# Patient Record
Sex: Male | Born: 1965 | Race: White | Hispanic: No | Smoking: Former smoker
Health system: Southern US, Community
[De-identification: ages and names within clinical notes are randomized; demographics above are authoritative.]

## PROBLEM LIST (undated history)

## (undated) DIAGNOSIS — Z87442 Personal history of urinary calculi: Secondary | ICD-10-CM

## (undated) DIAGNOSIS — R319 Hematuria, unspecified: Secondary | ICD-10-CM

## (undated) DIAGNOSIS — N201 Calculus of ureter: Secondary | ICD-10-CM

## (undated) DIAGNOSIS — N200 Calculus of kidney: Secondary | ICD-10-CM

## (undated) HISTORY — PX: OTHER SURGICAL HISTORY: SHX169

---

## 2004-08-02 ENCOUNTER — Emergency Department: Payer: Self-pay | Admitting: Emergency Medicine

## 2013-09-16 ENCOUNTER — Emergency Department: Payer: Self-pay | Admitting: Emergency Medicine

## 2013-10-23 ENCOUNTER — Encounter: Payer: Self-pay | Admitting: General Surgery

## 2013-10-23 ENCOUNTER — Ambulatory Visit (INDEPENDENT_AMBULATORY_CARE_PROVIDER_SITE_OTHER): Payer: BC Managed Care – PPO | Admitting: General Surgery

## 2013-10-23 VITALS — BP 140/86 | HR 90 | Temp 97.9°F | Resp 16 | Ht 69.0 in | Wt 259.0 lb

## 2013-10-23 DIAGNOSIS — T63391A Toxic effect of venom of other spider, accidental (unintentional), initial encounter: Secondary | ICD-10-CM

## 2013-10-23 DIAGNOSIS — T63301A Toxic effect of unspecified spider venom, accidental (unintentional), initial encounter: Secondary | ICD-10-CM

## 2013-10-23 DIAGNOSIS — T6391XA Toxic effect of contact with unspecified venomous animal, accidental (unintentional), initial encounter: Secondary | ICD-10-CM

## 2013-10-23 NOTE — Progress Notes (Signed)
Patient ID: Kerry MachoRoger D Dalziel, male   DOB: [birth year], 48 y.o.   MRN: [id number]  Chief Complaint  Patient presents with  . Other    spider bit     HPI [name] is a 48 y.o. male here today for an brown reclus bit on his right leg. He states it is swelling, red and painful.  He noticed this on 10/19/13.   HPI  History reviewed. No pertinent past medical history.  History reviewed. No pertinent past surgical history.  History reviewed. No pertinent family history.  Social History History  Substance Use Topics  . Smoking status: Never Smoker   . Smokeless tobacco: Never Used  . Alcohol Use: No    No Known Allergies  Current Outpatient Prescriptions  Medication Sig Dispense Refill  . cephALEXin (KEFLEX) 500 MG capsule Take 500 mg by mouth 4 (four) times daily.      . minocycline (DYNACIN) 100 MG tablet Take 100 mg by mouth 2 (two) times daily.       No current facility-administered medications for this visit.    Review of Systems Review of Systems  Constitutional: Negative.   Respiratory: Negative.   Cardiovascular: Negative.     Blood pressure 140/86, pulse 90, temperature 97.9 F (36.6 C), resp. rate 16, height 5\' 9"  (1.753 m), weight 259 lb (117.482 kg).  Physical Exam Physical Exam  Constitutional: He is oriented to person, place, and time. He appears well-developed and well-nourished.  Pulmonary/Chest: Effort normal and breath sounds normal.  Neurological: He is alert and oriented to person, place, and time.  Skin: Skin is warm and dry.  Right leg 5 mm skin opening alone the shin with surrounding induration and mild redness. No apparent skin necrosis    Data Reviewed    Assessment    Small wound right leg, possible insect bite but no esign of necrosis. Pt started keflex 2 days ago.     Plan    Continue with Keflex. Recheck in 2 week        SANKAR,SEEPLAPUTHUR G 10/24/2013, 5:58 AM

## 2013-10-23 NOTE — Patient Instructions (Addendum)
Patient to return in two weeks. Keep area cleansed and covered with a dressing. Call for any worsening or new problems in the area

## 2013-10-24 ENCOUNTER — Encounter: Payer: Self-pay | Admitting: General Surgery

## 2013-11-03 ENCOUNTER — Ambulatory Visit: Payer: BC Managed Care – PPO

## 2013-11-04 ENCOUNTER — Telehealth: Payer: Self-pay | Admitting: *Deleted

## 2013-11-04 NOTE — Telephone Encounter (Signed)
I talked with the patient and he forgot his appointment. He states the area is healed. The antibiotics cleared it up and it looks good. Follow up as needed, pt agrees.

## 2013-12-08 ENCOUNTER — Encounter (HOSPITAL_COMMUNITY): Payer: BC Managed Care – PPO | Admitting: Anesthesiology

## 2013-12-08 ENCOUNTER — Encounter (HOSPITAL_COMMUNITY)
Admission: EM | Disposition: A | Discharge status: Home / Self Care | Payer: Self-pay | Source: Home / Self Care | Attending: Emergency Medicine

## 2013-12-08 ENCOUNTER — Emergency Department (HOSPITAL_COMMUNITY)
Admission: EM | Admit: 2013-12-08 | Discharge: 2013-12-09 | Disposition: A | Discharge status: Home / Self Care | Payer: BC Managed Care – PPO | Attending: Emergency Medicine | Admitting: Emergency Medicine

## 2013-12-08 ENCOUNTER — Emergency Department (HOSPITAL_COMMUNITY): Payer: BC Managed Care – PPO | Admitting: Anesthesiology

## 2013-12-08 ENCOUNTER — Encounter (HOSPITAL_COMMUNITY): Payer: Self-pay | Admitting: Emergency Medicine

## 2013-12-08 ENCOUNTER — Emergency Department: Payer: Self-pay | Admitting: Emergency Medicine

## 2013-12-08 DIAGNOSIS — N200 Calculus of kidney: Secondary | ICD-10-CM

## 2013-12-08 DIAGNOSIS — N201 Calculus of ureter: Secondary | ICD-10-CM | POA: Insufficient documentation

## 2013-12-08 HISTORY — PX: CYSTOSCOPY/RETROGRADE/URETEROSCOPY: SHX5316

## 2013-12-08 LAB — URINALYSIS, COMPLETE
BILIRUBIN, UR: NEGATIVE
Glucose,UR: NEGATIVE mg/dL (ref 0–75)
Ketone: NEGATIVE
Nitrite: NEGATIVE
Ph: 5 (ref 4.5–8.0)
Protein: NEGATIVE
RBC,UR: 14 /HPF (ref 0–5)
Specific Gravity: 1.03 (ref 1.003–1.030)
Squamous Epithelial: 4

## 2013-12-08 LAB — CBC WITH DIFFERENTIAL/PLATELET
BASOS ABS: 0.1 10*3/uL (ref 0.0–0.1)
Basophil %: 0.5 %
Eosinophil #: 0.2 10*3/uL (ref 0.0–0.7)
Eosinophil %: 1.6 %
HCT: 50.8 % (ref 40.0–52.0)
HGB: 16.6 g/dL (ref 13.0–18.0)
Lymphocyte #: 2.4 10*3/uL (ref 1.0–3.6)
Lymphocyte %: 17.6 %
MCH: 30.6 pg (ref 26.0–34.0)
MCHC: 32.8 g/dL (ref 32.0–36.0)
MCV: 94 fL (ref 80–100)
MONO ABS: 1.2 x10 3/mm — AB (ref 0.2–1.0)
Monocyte %: 8.9 %
NEUTROS ABS: 9.6 10*3/uL — AB (ref 1.4–6.5)
Neutrophil %: 71.4 %
Platelet: 230 10*3/uL (ref 150–440)
RBC: 5.43 10*6/uL (ref 4.40–5.90)
RDW: 13.4 % (ref 11.5–14.5)
WBC: 13.4 10*3/uL — AB (ref 3.8–10.6)

## 2013-12-08 LAB — COMPREHENSIVE METABOLIC PANEL
Albumin: 3.5 g/dL (ref 3.4–5.0)
Alkaline Phosphatase: 70 U/L
Anion Gap: 11 (ref 7–16)
BUN: 19 mg/dL — AB (ref 7–18)
Bilirubin,Total: 0.8 mg/dL (ref 0.2–1.0)
CO2: 22 mmol/L (ref 21–32)
CREATININE: 1.7 mg/dL — AB (ref 0.60–1.30)
Calcium, Total: 8.5 mg/dL (ref 8.5–10.1)
Chloride: 106 mmol/L (ref 98–107)
EGFR (African American): 53 — ABNORMAL LOW
GFR CALC NON AF AMER: 46 — AB
GLUCOSE: 156 mg/dL — AB (ref 65–99)
Osmolality: 283 (ref 275–301)
Potassium: 3.5 mmol/L (ref 3.5–5.1)
SGOT(AST): 33 U/L (ref 15–37)
SGPT (ALT): 32 U/L
SODIUM: 139 mmol/L (ref 136–145)
TOTAL PROTEIN: 7.7 g/dL (ref 6.4–8.2)

## 2013-12-08 LAB — LIPASE, BLOOD: LIPASE: 163 U/L (ref 73–393)

## 2013-12-08 IMAGING — CT CT STONE STUDY
2 of 4 series · 16 of 46 positions shown, 18 images · non-contrast
Comparison: None.

CLINICAL DATA: Flank pain with nausea and microscopic hematuria

EXAM:
CT ABDOMEN AND PELVIS WITHOUT CONTRAST
TECHNIQUE: Multidetector CT imaging of the abdomen and pelvis was performed
following the standard protocol without oral or intravenous contrast
material administration.

[Series 2: stone standard full · axial · 0.95mm/px · z∈[-800,-335]mm · 13 of 103 slices shown, 15 images]
[im 5/103  soft-tissue]
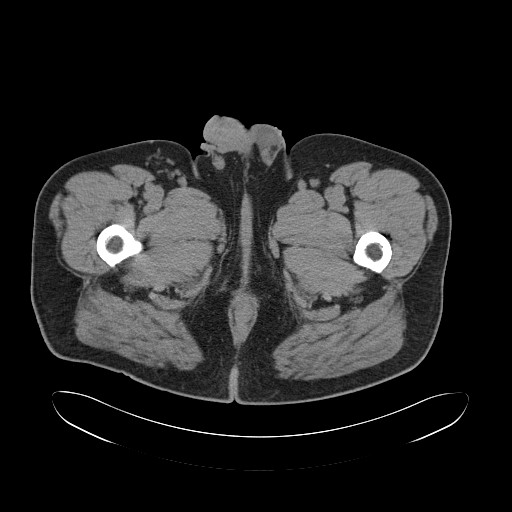
[im 5/103  bone]
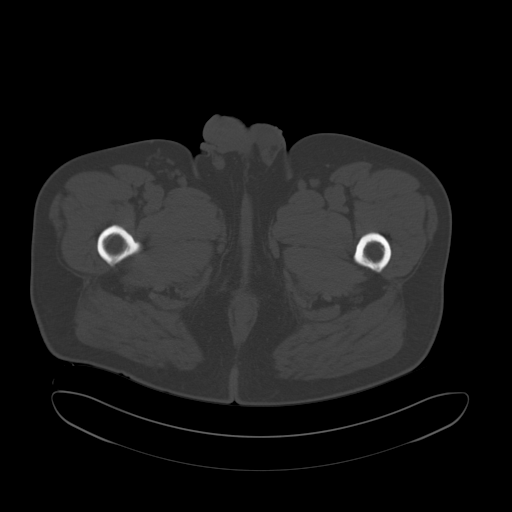
[im 13/103  soft-tissue]
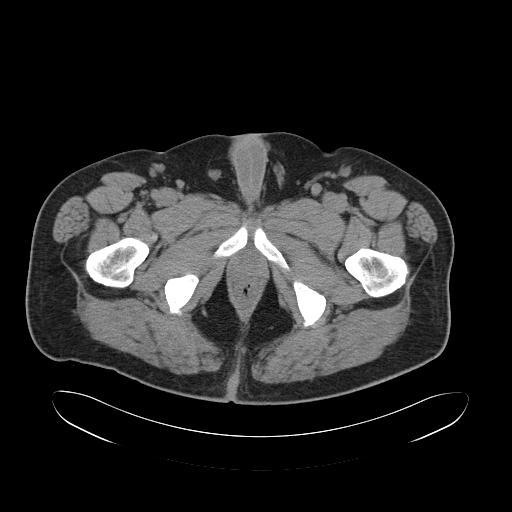
[im 21/103  soft-tissue]
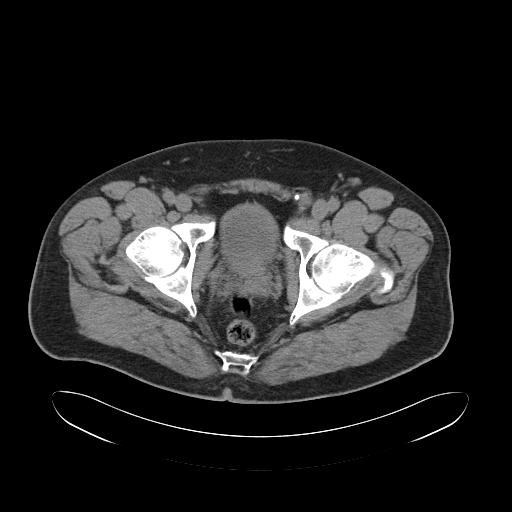
[im 29/103  soft-tissue]
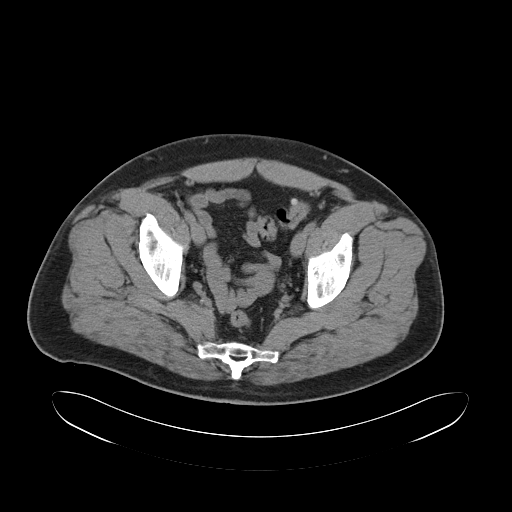
[im 37/103  soft-tissue]
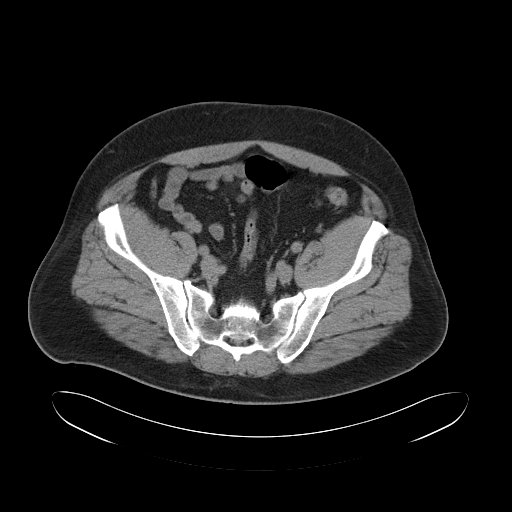
[im 45/103  soft-tissue]
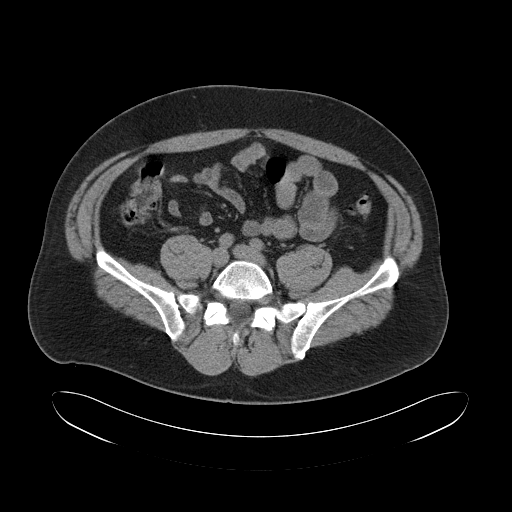
[im 54/103  soft-tissue]
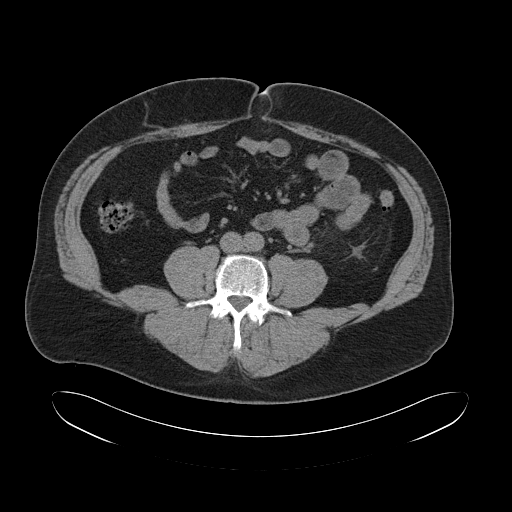
[im 58/103  soft-tissue]
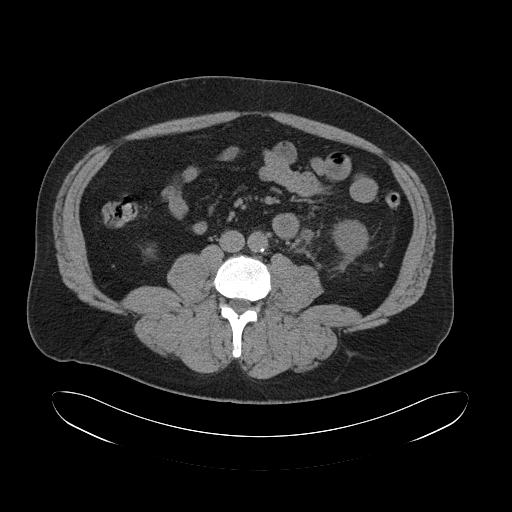
[im 66/103  soft-tissue]
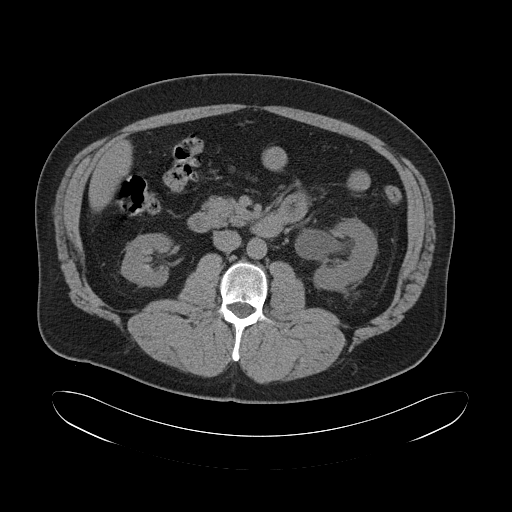
[im 66/103  bone]
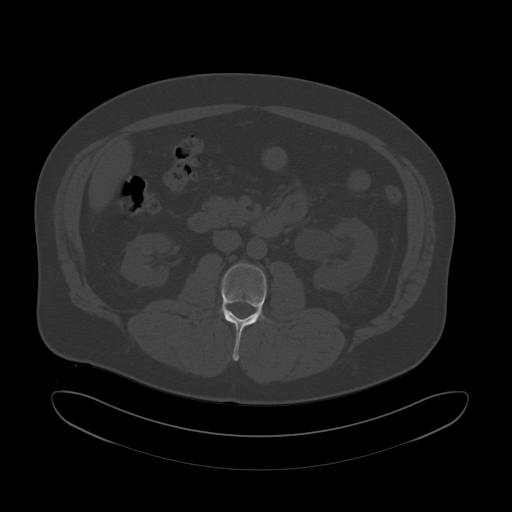
[im 74/103  soft-tissue]
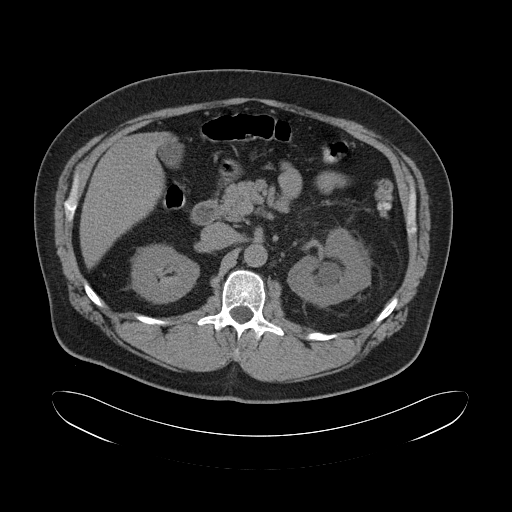
[im 82/103  soft-tissue]
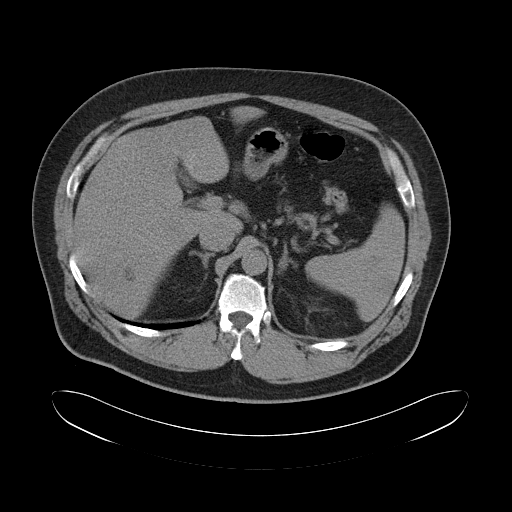
[im 90/103  soft-tissue]
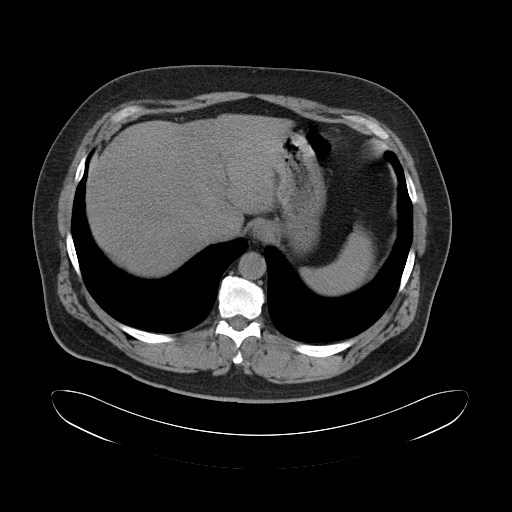
[im 98/103  soft-tissue]
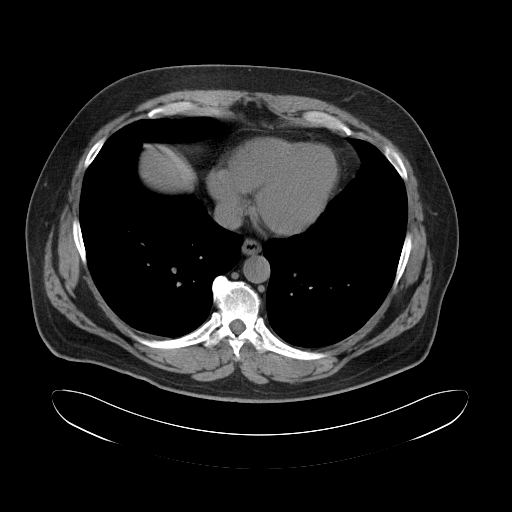

[Series 5: cor stone standard full · coronal · 0.90mm/px · 3 of 170 slices shown]
[im 57/170  soft-tissue]
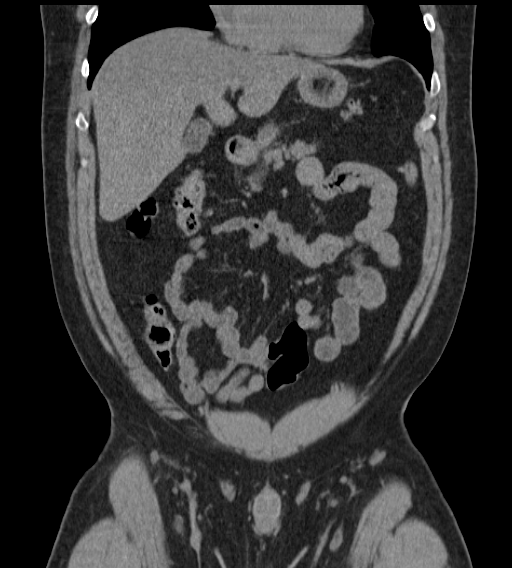
[im 76/170  soft-tissue]
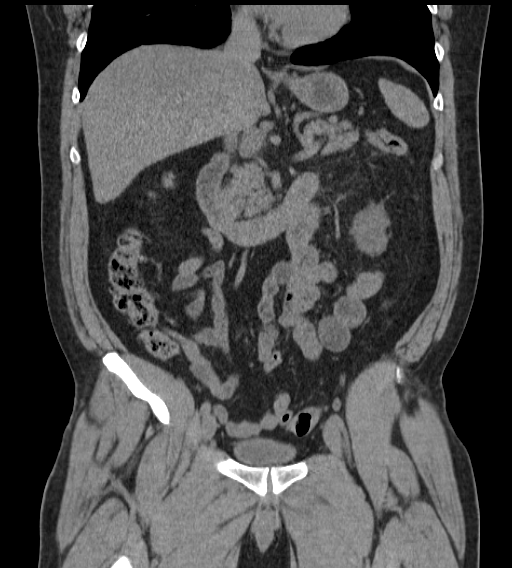
[im 94/170  soft-tissue]
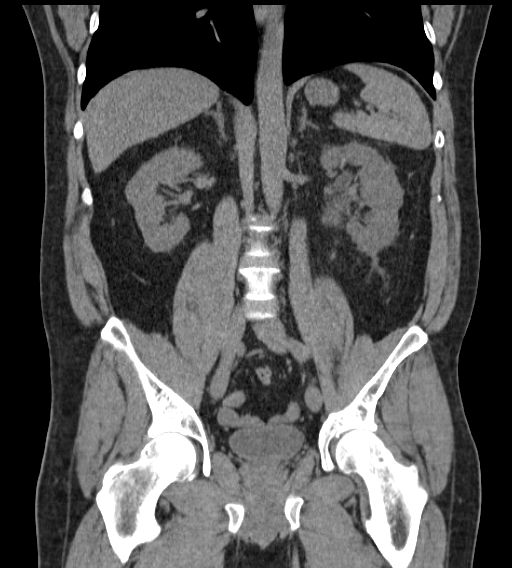

[16 of 46 positions shown; findings below may reference images not displayed]

FINDINGS: Lung bases are clear.

Liver is prominent measuring 18.3 cm in length. There is a focal
area of decreased attenuation in the posterior segment of the right
lobe of the liver measuring 1.8 x 1.1 cm, possibly a hemangioma. No
other focal liver lesions are identified on this study. Gallbladder
wall is not thickened. There is no biliary duct dilatation.

Spleen, pancreas, and adrenals appear normal.

In the right kidney, there is a 3 x 2 mm subtle calculus in the
anterior midpole region. There is no hydronephrosis or mass in right
kidney. There is no right ureteral calculus.

On the left, the kidney is edematous. There is moderately severe
hydronephrosis on the left. There is a 4 x 3 mm calculus in the
upper pole of the left kidney. There is stranding in Gerota's fascia
on the left. There is a 4 x 3 mm calculus in the proximal left
ureter at the level of L3-4, best seen on axial slice 48 series 2.
No other ureteral calculi are identified.

There is a small ventral hernia containing only fat.

In the pelvis, the urinary bladder is midline with normal wall
thickness. There is no pelvic mass or fluid collection. There is a
small calcification in the prostate consistent with a small
prostatic calculus. There are scattered sigmoid diverticula without
diverticulitis. Appendix appears normal.

There is no bowel obstruction. No free air or portal venous air.
There is no ascites, adenopathy, or abscess in the abdomen or
pelvis. There is atherosclerotic change in the aorta but no
aneurysm. There are no appreciable blastic or lytic bone lesions.
IMPRESSION: 4 mm calculus in the proximal left ureter causing moderately severe
hydronephrosis on the left. There are intrarenal calculi
bilaterally, larger on the left than on the right. No hydronephrosis
on the right.

Liver is prominent. A small lesion in the posterior segment of the
right lobe may represent a hemangioma. Pre and post-contrast MR of
the liver would be the optimal imaging study of choice to further
assess this area.

Small ventral hernia containing only fat.

Small calculus in the prostate.

No bowel obstruction.  No abscess.  Appendix appears normal.

## 2013-12-08 SURGERY — CYSTOSCOPY, WITH STENT INSERTION
Anesthesia: Choice | Laterality: Left

## 2013-12-08 SURGERY — CYSTOSCOPY/RETROGRADE/URETEROSCOPY
Anesthesia: General | Site: Ureter | Laterality: Left

## 2013-12-08 MED ORDER — MIDAZOLAM HCL 5 MG/5ML IJ SOLN
INTRAMUSCULAR | Status: DC | PRN
  Administered 2013-12-08: 1 mg via INTRAVENOUS

## 2013-12-08 MED ORDER — MEPERIDINE HCL 50 MG/ML IJ SOLN: 6.2500 mg | INTRAMUSCULAR | Status: DC | PRN

## 2013-12-08 MED ORDER — LACTATED RINGERS IV SOLN
INTRAVENOUS | Status: DC | PRN
  Administered 2013-12-08: 22:00:00 via INTRAVENOUS

## 2013-12-08 MED ORDER — FENTANYL CITRATE 0.05 MG/ML IJ SOLN
INTRAMUSCULAR | Status: DC | PRN
  Administered 2013-12-08: 50 ug via INTRAVENOUS

## 2013-12-08 MED ORDER — HYDROMORPHONE HCL PF 1 MG/ML IJ SOLN
1.0000 mg | INTRAMUSCULAR | Status: DC | PRN
  Administered 2013-12-08: 1 mg via INTRAVENOUS
  Filled 2013-12-08: qty 1

## 2013-12-08 MED ORDER — LIDOCAINE HCL (CARDIAC) 20 MG/ML IV SOLN
INTRAVENOUS | Status: AC
  Filled 2013-12-08: qty 5

## 2013-12-08 MED ORDER — PHENAZOPYRIDINE HCL 200 MG PO TABS: 200.0000 mg | ORAL_TABLET | Freq: Three times a day (TID) | ORAL | Status: DC | PRN

## 2013-12-08 MED ORDER — METOCLOPRAMIDE HCL 5 MG/ML IJ SOLN
INTRAMUSCULAR | Status: DC | PRN
  Administered 2013-12-08: 5 mg via INTRAVENOUS

## 2013-12-08 MED ORDER — CIPROFLOXACIN IN D5W 400 MG/200ML IV SOLN
400.0000 mg | INTRAVENOUS | Status: AC
  Administered 2013-12-08: 400 mg via INTRAVENOUS
  Filled 2013-12-08: qty 200

## 2013-12-08 MED ORDER — OXYCODONE HCL 5 MG PO TABS: 5.0000 mg | ORAL_TABLET | ORAL | Status: DC | PRN

## 2013-12-08 MED ORDER — CIPROFLOXACIN HCL 500 MG PO TABS: 500.0000 mg | ORAL_TABLET | Freq: Two times a day (BID) | ORAL | Status: DC

## 2013-12-08 MED ORDER — ONDANSETRON HCL 4 MG/2ML IJ SOLN
INTRAMUSCULAR | Status: DC | PRN
  Administered 2013-12-08: 4 mg via INTRAVENOUS

## 2013-12-08 MED ORDER — OXYCODONE-ACETAMINOPHEN 5-325 MG PO TABS: 1.0000 | ORAL_TABLET | ORAL | Status: DC | PRN

## 2013-12-08 MED ORDER — LIDOCAINE HCL (CARDIAC) 20 MG/ML IV SOLN
INTRAVENOUS | Status: DC | PRN
  Administered 2013-12-08: 75 mg via INTRAVENOUS

## 2013-12-08 MED ORDER — HYDROMORPHONE HCL PF 1 MG/ML IJ SOLN: 0.2500 mg | INTRAMUSCULAR | Status: DC | PRN

## 2013-12-08 MED ORDER — FENTANYL CITRATE 0.05 MG/ML IJ SOLN
INTRAMUSCULAR | Status: AC
  Filled 2013-12-08: qty 5

## 2013-12-08 MED ORDER — DEXAMETHASONE SODIUM PHOSPHATE 10 MG/ML IJ SOLN
INTRAMUSCULAR | Status: AC
  Filled 2013-12-08: qty 1

## 2013-12-08 MED ORDER — PROMETHAZINE HCL 25 MG/ML IJ SOLN: 6.2500 mg | INTRAMUSCULAR | Status: DC | PRN

## 2013-12-08 MED ORDER — MIDAZOLAM HCL 2 MG/2ML IJ SOLN
INTRAMUSCULAR | Status: AC
  Filled 2013-12-08: qty 2

## 2013-12-08 MED ORDER — OXYCODONE HCL 5 MG/5ML PO SOLN
5.0000 mg | Freq: Once | ORAL | Status: AC | PRN
  Filled 2013-12-08: qty 5

## 2013-12-08 MED ORDER — PROMETHAZINE HCL 25 MG PO TABS: 25.0000 mg | ORAL_TABLET | Freq: Four times a day (QID) | ORAL | Status: DC | PRN

## 2013-12-08 MED ORDER — BELLADONNA ALKALOIDS-OPIUM 16.2-60 MG RE SUPP
RECTAL | Status: AC
  Filled 2013-12-08: qty 1

## 2013-12-08 MED ORDER — IOHEXOL 300 MG/ML  SOLN
INTRAMUSCULAR | Status: DC | PRN
  Administered 2013-12-08: 5 mL

## 2013-12-08 MED ORDER — DEXAMETHASONE SODIUM PHOSPHATE 10 MG/ML IJ SOLN
INTRAMUSCULAR | Status: DC | PRN
  Administered 2013-12-08: 10 mg via INTRAVENOUS

## 2013-12-08 MED ORDER — 0.9 % SODIUM CHLORIDE (POUR BTL) OPTIME
TOPICAL | Status: DC | PRN
  Administered 2013-12-08: 1000 mL

## 2013-12-08 MED ORDER — OXYCODONE HCL 5 MG PO TABS
ORAL_TABLET | ORAL | Status: AC
  Filled 2013-12-08: qty 1

## 2013-12-08 MED ORDER — ONDANSETRON HCL 4 MG/2ML IJ SOLN
INTRAMUSCULAR | Status: AC
  Filled 2013-12-08: qty 2

## 2013-12-08 MED ORDER — CIPROFLOXACIN IN D5W 400 MG/200ML IV SOLN
INTRAVENOUS | Status: DC | PRN
  Administered 2013-12-08: 400 mg via INTRAVENOUS

## 2013-12-08 MED ORDER — CIPROFLOXACIN IN D5W 400 MG/200ML IV SOLN
INTRAVENOUS | Status: AC
  Filled 2013-12-08: qty 200

## 2013-12-08 MED ORDER — LIDOCAINE HCL 2 % EX GEL
CUTANEOUS | Status: AC
  Filled 2013-12-08: qty 10

## 2013-12-08 MED ORDER — PROPOFOL 10 MG/ML IV BOLUS
INTRAVENOUS | Status: DC | PRN
  Administered 2013-12-08: 200 mg via INTRAVENOUS

## 2013-12-08 MED ORDER — OXYCODONE HCL 5 MG PO TABS
5.0000 mg | ORAL_TABLET | Freq: Once | ORAL | Status: AC | PRN
Start: 2013-12-08 — End: 2013-12-08
  Administered 2013-12-08: 5 mg via ORAL

## 2013-12-08 MED ORDER — SODIUM CHLORIDE 0.9 % IR SOLN
Status: DC | PRN
  Administered 2013-12-08: 3000 mL

## 2013-12-08 SURGICAL SUPPLY — 19 items
BAG URO CATCHER STRL LF (DRAPE) ×3 IMPLANT
BASKET LASER NITINOL 1.9FR (BASKET) IMPLANT
BASKET ZERO TIP NITINOL 2.4FR (BASKET) IMPLANT
CATH URET 5FR 28IN OPEN ENDED (CATHETERS) ×3 IMPLANT
CLOTH BEACON ORANGE TIMEOUT ST (SAFETY) ×3 IMPLANT
DRAPE CAMERA CLOSED 9X96 (DRAPES) ×3 IMPLANT
GLOVE BIOGEL PI IND STRL 7.5 (GLOVE) ×1 IMPLANT
GLOVE BIOGEL PI INDICATOR 7.5 (GLOVE) ×2
GLOVE SURG SS PI 7.5 STRL IVOR (GLOVE) ×3 IMPLANT
GLOVE SURG SS PI 8.0 STRL IVOR (GLOVE) IMPLANT
GOWN STRL REUS W/TWL XL LVL3 (GOWN DISPOSABLE) ×6 IMPLANT
GUIDEWIRE ANG ZIPWIRE 038X150 (WIRE) IMPLANT
GUIDEWIRE STR DUAL SENSOR (WIRE) ×3 IMPLANT
MANIFOLD NEPTUNE II (INSTRUMENTS) ×3 IMPLANT
PACK CYSTO (CUSTOM PROCEDURE TRAY) ×3 IMPLANT
STENT CONTOUR 6FRX24X.038 (STENTS) ×3 IMPLANT
TUBING CONNECTING 10 (TUBING) ×2 IMPLANT
TUBING CONNECTING 10' (TUBING) ×1
WIRE COONS/BENSON .038X145CM (WIRE) IMPLANT

## 2013-12-08 NOTE — Consult Note (Signed)
Subjective: [name] is a 48 yo WM who I was asked to see in consultation by Dr. Littie DeedsGentry for a 4mm left ureteral stone with obstruction and a possible UTI.   He had a stone [place] with ure[place] on the right and he had to have a stent for a possible stricture.   His pain was severe this morning with nausea.  He had no voiding complaints or hematuria.  He has had no fever but his urine looked infected and he is tachycardic with an elevated Cr.    He has had no other GU history.  ROS:  Review of Systems  Constitutional: Negative for fever and chills.  Gastrointestinal: Positive for nausea.  Genitourinary: Positive for flank pain (right).  All other systems reviewed and are negative.  No Known Allergies  Past Medical History  Diagnosis Date  . Kidney stones     Past Surgical History  Procedure Laterality Date  . Lithotripsy    . Cystoscopy w/ ureteroscopy w/ lithotripsy      History   Social History  . Marital Status: Single    Spouse Name: N/A    Number of Children: N/A  . Years of Education: N/A   Occupational History  . machine operator    Social History Main Topics  . Smoking status: Never Smoker   . Smokeless tobacco: Never Used  . Alcohol Use: No  . Drug Use: No  . Sexual Activity: Not on file   Other Topics Concern  . Not on file   Social History Narrative  . No narrative on file    History reviewed. No pertinent family history.  Anti-infectives: Anti-infectives   None      Current Facility-Administered Medications  Medication Dose Route Frequency Provider Last Rate Last Dose  . HYDROmorphone (DILAUDID) injection 1 mg  1 mg Intravenous Q30 min PRN Mirian MoMatthew Gentry, MD   1 mg at 12/08/13 1844   Current Outpatient Prescriptions  Medication Sig Dispense Refill  . cephALEXin (KEFLEX) 500 MG capsule Take 500 mg by mouth 4 (four) times daily.      . minocycline (DYNACIN) 100 MG tablet Take 100 mg by mouth 2 (two) times daily.          Objective: Vital signs in last 24 hours: Pulse Rate:  [75] 75 (08/10 1828) Resp:  [18] 18 (08/10 1828) BP: (120)/(80) 120/80 mmHg (08/10 1828) SpO2:  [99 %-100 %] 100 % (08/10 1828)  Intake/Output from previous day:   Intake/Output this shift:     Physical Exam  Constitutional: He is oriented to person, place, and time and well-developed, well-nourished, and in no distress.  HENT:  Head: Normocephalic and atraumatic.  Neck: Normal range of motion. Neck supple.  Cardiovascular: Regular rhythm.   tachycardia  Pulmonary/Chest: Effort normal and breath sounds normal. No respiratory distress.  Abdominal: Soft. Bowel sounds are normal. There is tenderness (left CVAT moderate).  Musculoskeletal: Normal range of motion. He exhibits no edema.  Neurological: He is alert and oriented to person, place, and time.  Skin: Skin is warm and dry.  Psychiatric: Mood and affect normal.   His Cr is 1.7 and his WBC is 13.4.   The UA is nit - with 14 RBC's and 59 WBC's.    I have reviewed his CT films which show a 4mm obstructing left proximal stone with renal stones.   Assessment: He has a proximal left ureteral stone with ARI and a possible UTI.  Plan: I am going to take him for cystoscopy and stenting tonight and will then have him return for ureteroscopy at a later date.   The risks were reviewed in detail.   CC: Dr. M. Gentry.     LOS: 0 days    [name] 12/08/2013  

## 2013-12-08 NOTE — Transfer of Care (Signed)
Immediate Anesthesia Transfer of Care Note  Patient: [name]  Procedure(s) Performed: Procedure(s): CYSTOSCOPY/LEFT RETROGRADE PYELOGRAM/ INSERTION LEFT URETERAL STENT (Left)  Patient Location: PACU  Anesthesia Type:General  Level of Consciousness: awake, oriented, patient cooperative and responds to stimulation  Airway & Oxygen Therapy: Patient Spontanous Breathing and Patient connected to face mask oxygen  Post-op Assessment: Report given to PACU RN, Post -op Vital signs reviewed and stable and Patient moving all extremities  Post vital signs: Reviewed and stable  Complications: No apparent anesthesia complications

## 2013-12-08 NOTE — Anesthesia Preprocedure Evaluation (Signed)
Anesthesia Evaluation  Patient identified by MRN, date of birth, ID band Patient awake    Reviewed: Allergy & Precautions, H&P , NPO status , Patient's Chart, lab work & pertinent test results  Airway Mallampati: II TM Distance: >3 FB Neck ROM: Full    Dental no notable dental hx.    Pulmonary neg pulmonary ROS,  breath sounds clear to auscultation  Pulmonary exam normal       Cardiovascular negative cardio ROS  Rhythm:Regular Rate:Normal     Neuro/Psych negative neurological ROS  negative psych ROS   GI/Hepatic negative GI ROS, Neg liver ROS,   Endo/Other  Morbid obesity  Renal/GU Renal disease     Musculoskeletal negative musculoskeletal ROS (+)   Abdominal   Peds  Hematology negative hematology ROS (+)   Anesthesia Other Findings   Reproductive/Obstetrics negative OB ROS                           Anesthesia Physical Anesthesia Plan  ASA: II  Anesthesia Plan: General   Post-op Pain Management:    Induction: Intravenous  Airway Management Planned: LMA and Oral ETT  Additional Equipment:   Intra-op Plan:   Post-operative Plan: Extubation in OR  Informed Consent: I have reviewed the patients History and Physical, chart, labs and discussed the procedure including the risks, benefits and alternatives for the proposed anesthesia with the patient or authorized representative who has indicated his/her understanding and acceptance.   Dental advisory given  Plan Discussed with: CRNA  Anesthesia Plan Comments:         Anesthesia Quick Evaluation

## 2013-12-08 NOTE — Anesthesia Procedure Notes (Signed)
Procedure Name: LMA Insertion Date/Time: 12/08/2013 10:01 PM Performed by: Edison PaceGRAY, Alphonsus Doyel E Pre-anesthesia Checklist: Patient identified, Emergency Drugs available, Suction available, Patient being monitored and Timeout performed Patient Re-evaluated:Patient Re-evaluated prior to inductionOxygen Delivery Method: Circle system utilized Preoxygenation: Pre-oxygenation with 100% oxygen Intubation Type: IV induction LMA: LMA inserted LMA Size: 4.0 Number of attempts: 1 Placement Confirmation: positive ETCO2 Tube secured with: Tape Dental Injury: Teeth and Oropharynx as per pre-operative assessment

## 2013-12-08 NOTE — ED Notes (Signed)
MD at bedside. 

## 2013-12-08 NOTE — ED Notes (Signed)
Pt transported to OR with OR staff at this time

## 2013-12-08 NOTE — Brief Op Note (Signed)
12/08/2013  10:20 PM  PATIENT:  [name]  48 y.o. male  PRE-OPERATIVE DIAGNOSIS:  left ureteral stone  POST-OPERATIVE DIAGNOSIS:  left ureteral stone  PROCEDURE:  Procedure(s): CYSTOSCOPY/LEFT RETROGRADE PYELOGRAM/ INSERTION LEFT URETERAL STENT (Left)  SURGEON:  Surgeon(s) and Role:    * Anner CreteJohn J Jasman Pfeifle, MD - Primary  PHYSICIAN ASSISTANT:   ASSISTANTS: none   ANESTHESIA:   general  EBL:     BLOOD ADMINISTERED:none  DRAINS: 6 x 26 left JJ stent   LOCAL MEDICATIONS USED:  NONE  SPECIMEN:  No Specimen  DISPOSITION OF SPECIMEN:  N/A  COUNTS:  YES  TOURNIQUET:  * No tourniquets in log *  DICTATION: .Other Dictation: Dictation Number [id number]  PLAN OF CARE: Discharge to home after PACU  PATIENT DISPOSITION:  PACU - hemodynamically stable.   Delay start of Pharmacological VTE agent (>24hrs) due to surgical blood loss or risk of bleeding: not applicable

## 2013-12-08 NOTE — ED Notes (Signed)
Per [facility], [facility]. Dx with 4 mm L sided kidney stone. [facility] to [facility] up with urology. BUN and creatine elevated. 1638 1 mg dilaudid IV given. 20 g L hand. 1 L NS already infused. 1 g Rocephin IV already infused.

## 2013-12-08 NOTE — Discharge Instructions (Addendum)
Ureteral Stent Implantation, Care After °Refer to this sheet in the next few weeks. These instructions provide you with information on caring for yourself after your procedure. Your health care provider may also give you more specific instructions. Your treatment has been planned according to current medical practices, but problems sometimes occur. Call your health care provider if you have any problems or questions after your procedure. °WHAT TO EXPECT AFTER THE PROCEDURE °You should be back to normal activity within 48 hours after the procedure. Nausea and vomiting may occur and are commonly the result of anesthesia. °It is common to experience sharp pain in the back or lower abdomen and penis with voiding. This is caused by movement of the ends of the stent with the act of urinating. It usually goes away within minutes after you have stopped urinating. °HOME CARE INSTRUCTIONS °Make sure to drink plenty of fluids. You may have small amounts of bleeding, causing your urine to be red. This is normal. Certain movements may trigger pain or a feeling that you need to urinate. You may be given medicines to prevent infection or bladder spasms. Be sure to take all medicines as directed. Only take over-the-counter or prescription medicines for pain, discomfort, or fever as directed by your health care provider. Do not take aspirin, as this can make bleeding worse. °Your stent will be left in until the blockage is resolved. This may take 2 weeks or longer, depending on the reason for stent implantation. You may have an X-ray exam to make sure your ureter is open and that the stent has not moved out of position (migrated). The stent can be removed by your health care provider in the office. Medicines may be given for comfort while the stent is being removed. Be sure to keep all follow-up appointments so your health care provider can check that you are healing properly. °SEEK MEDICAL CARE IF: °· You experience increasing  pain. °· Your pain medicine is not working. °SEEK IMMEDIATE MEDICAL CARE IF: °· Your urine is dark red or has blood clots. °· You are leaking urine (incontinent). °· You have a fever, chills, feeling sick to your stomach (nausea), or vomiting. °· Your pain is not relieved by pain medicine. °· The end of the stent comes out of the urethra. °· You are unable to urinate. °Document Released: 12/18/2012 Document Revised: 04/22/2013 Document Reviewed: 12/18/2012 °[employer]® Patient Information ©2015 [employer]. This information is not intended to replace advice given to you by your health care provider. Make sure you discuss any questions you have with your health care provider. ° °

## 2013-12-08 NOTE — ED Provider Notes (Signed)
CSN: [id number]     Arrival date & time 12/08/13  1823 History   First MD Initiated Contact with Patient 12/08/13 1835     Chief Complaint  Patient presents with  . Nephrolithiasis     (Consider location/radiation/quality/duration/timing/severity/associated sxs/prior Treatment) Patient is a 48 y.o. male presenting with abdominal pain.  Abdominal Pain Pain location:  L flank Pain quality: cramping   Pain radiates to:  Does not radiate Pain severity:  Severe Onset quality:  Sudden Duration:  14 hours Timing:  Constant Progression:  Worsening Chronicity:  New Relieved by:  Nothing Worsened by:  Nothing tried Ineffective treatments:  None tried Associated symptoms: anorexia, chills and nausea   Associated symptoms: no chest pain, no constipation, no cough, no diarrhea, no dysuria, no fever, no hematuria, no shortness of breath, no sore throat and no vomiting     Past Medical History  Diagnosis Date  . Kidney stones    Past Surgical History  Procedure Laterality Date  . Lithotripsy    . Cystoscopy w/ ureteroscopy w/ lithotripsy     History reviewed. No pertinent family history. History  Substance Use Topics  . Smoking status: Never Smoker   . Smokeless tobacco: Never Used  . Alcohol Use: No    Review of Systems  Constitutional: Positive for chills. Negative for fever.  HENT: Negative for congestion, rhinorrhea and sore throat.   Eyes: Negative for photophobia and visual disturbance.  Respiratory: Negative for cough and shortness of breath.   Cardiovascular: Negative for chest pain and leg swelling.  Gastrointestinal: Positive for nausea, abdominal pain and anorexia. Negative for vomiting, diarrhea and constipation.  Endocrine: Negative for polydipsia and polyuria.  Genitourinary: Negative for dysuria and hematuria.  Musculoskeletal: Negative for arthralgias and back pain.  Skin: Negative for color change and rash.  Neurological: Negative for dizziness, syncope,  light-headedness and headaches.  Hematological: Negative for adenopathy. Does not bruise/bleed easily.  All other systems reviewed and are negative.     Allergies  Review of patient's allergies indicates no known allergies.  Home Medications   Prior to Admission medications   Medication Sig Start Date End Date Taking? Authorizing Provider  Multiple Vitamin (MULTIVITAMIN WITH MINERALS) TABS tablet Take 1 tablet by mouth daily.   Yes Historical Provider, MD  naproxen sodium (ANAPROX) 220 MG tablet Take 440 mg by mouth 2 (two) times daily as needed (pain).   Yes Historical Provider, MD  ciprofloxacin (CIPRO) 500 MG tablet Take 1 tablet (500 mg total) by mouth 2 (two) times daily. 12/08/13   Anner Crete, MD  oxyCODONE-acetaminophen (ROXICET) 5-325 MG per tablet Take 1 tablet by mouth every 4 (four) hours as needed for severe pain. 12/08/13   Anner Crete, MD  phenazopyridine (PYRIDIUM) 200 MG tablet Take 1 tablet (200 mg total) by mouth 3 (three) times daily as needed for pain. 12/08/13   Anner Crete, MD  promethazine (PHENERGAN) 25 MG tablet Take 1 tablet (25 mg total) by mouth every 6 (six) hours as needed for nausea or vomiting. 12/08/13   Anner Crete, MD   BP 122/78  Pulse 76  Temp(Src) 98 F (36.7 C)  Resp 14  SpO2 10% Physical Exam  Vitals reviewed. Constitutional: He is oriented to person, place, and time. He appears well-developed and well-nourished.  HENT:  Head: Normocephalic and atraumatic.  Eyes: Conjunctivae and EOM are normal.  Neck: Normal range of motion. Neck supple.  Cardiovascular: Normal rate, regular rhythm and normal heart sounds.  Pulmonary/Chest: Effort normal and breath sounds normal. No respiratory distress.  Abdominal: He exhibits no distension. There is tenderness in the left lower quadrant. There is CVA tenderness (L). There is no rebound and no guarding.  Musculoskeletal: Normal range of motion.  Neurological: He is alert and oriented to person, place,  and time.  Skin: Skin is warm and dry.    ED Course  Procedures (including critical care time) Labs Review Labs Reviewed - No data to display  Imaging Review No results found.   EKG Interpretation None      MDM   Final diagnoses:  None    48 y.o. male  with pertinent PMH of prior obstructive nephrolithiasis presents with recurrent L flank nephrolithiasis sx and 4mm obstructive stones with signs of infection.  Cr also elevated to 1.7 and pt with tachycardia to 118 on arrival despite pain well controlled.  Physical exam as above.  Consulted urology who accepted the patient.  Patient received Rocephin prior to arrival..  Admitted in stable condition  Labs and imaging as above reviewed.   Clinical Impression: Nephrolithiasis    Mirian MoMatthew Gentry, MD 12/09/13 0025

## 2013-12-09 ENCOUNTER — Other Ambulatory Visit: Payer: Self-pay | Admitting: Urology

## 2013-12-09 ENCOUNTER — Encounter (HOSPITAL_COMMUNITY): Payer: Self-pay | Admitting: Urology

## 2013-12-09 NOTE — Op Note (Signed)
NAMGregor Hams:  Buffkin, Danarius                 ACCOUNT NO.:  [id number]  MEDICAL RECORD NO.:  [id number]  LOCATION:  [facility]                         [facility]  PHYSICIAN:  Excell SeltzerJohn J. Annabell HowellsWrenn, M.D.    DATE OF BIRTH:  1966/01/13  DATE OF PROCEDURE:  12/08/2013 DATE OF DISCHARGE:                              OPERATIVE REPORT   PROCEDURE:  Cysto, left retrograde pyelogram with interpretation, insertion of left double-J stent.  PREOPERATIVE DIAGNOSIS:  Left proximal ureteral stone with pyuria.  POSTOPERATIVE DIAGNOSIS:  Left proximal ureteral stone with pyuria, with urethral stricture disease.  SURGEON:  Excell SeltzerJohn J. Annabell HowellsWrenn, M.D.  ANESTHESIA:  General.  SPECIMEN:  None.  DRAINS:  A 6-French x 26-cm double-J stent.  COMPLICATIONS:  None.  INDICATIONS:  Mr. Cherlynn PoloBarts is a 48 year old white male with a history of stones who presented today with right flank pain.  He was found to have a 4-mm right proximal stone with hydronephrosis.  His urine had pyuria and had a mild leukocytosis but no fever.  He had a prior history of left ureteroscopy well over a year ago with questionable ureteral injury and stricture that require more prolonged stent drainage.  It was felt that cysto, retrograde pyelography, and insertion of the stent was the most appropriate course of action this evening he remains asymptomatic with delayed ureteroscopy.  FINDINGS OF PROCEDURE:  He was taken to the operating room where general anesthetic was induced.  He received Cipro.  He was placed in lithotomy position and fitted with PAS hose.  Perineum and genitalia were prepped with Betadine solution and draped in usual sterile fashion.  Cystoscopy was performed using the 22-French scope and 12-degree lens. Examination revealed mild panurethral stricture disease with nothing that precluded passage of the scope.  The external sphincter was intact. 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  Examination of bladder revealed mild trabeculation.  No tumor, stones, or inflammation were noted.  Ureteral orifices were unremarkable.  The right ureteral orifice was cannulated with 5-French open-end catheter and contrast was instilled.  This revealed a normal ureter without obvious stricture to the level of 4 mm stone just below the UPJ. There was dilation proximally in the collecting system.  Once retrograde pyelogram had been performed, a guidewire was passed to the kidney.  There was some resistance at the level of the stone but it popped into the kidney leaving the stent in place.  A 6-French x 26-cm double-J stent was then inserted over the wire to the kidney.  The stent passed easily to the level of stone.  There were some resistance there but it eventually slid into the kidney without significant difficulty.  The wire was removed leaving good coil in the kidney and a good coil in the bladder.  The bladder was then drained.  The cystoscope was removed.  The patient was taken down from lithotomy position.  His anesthetic was reversed. He was moved to recovery room in stable condition.  There were no complications.     Excell SeltzerJohn J. Annabell HowellsWrenn, M.D.     JJW/MEDQ  D:  12/08/2013  T:  12/09/2013  Job:  [id number]

## 2013-12-10 LAB — URINE CULTURE

## 2013-12-10 NOTE — Anesthesia Postprocedure Evaluation (Signed)
Anesthesia Post Note  Patient: [name]  Procedure(s) Performed: Procedure(s) (LRB): CYSTOSCOPY/LEFT RETROGRADE PYELOGRAM/ INSERTION LEFT URETERAL STENT (Left)  Anesthesia type: General  Patient location: PACU  Post pain: Pain level controlled  Post assessment: Post-op Vital signs reviewed  Last Vitals: BP 122/78  Pulse 76  Temp(Src) 36.7 C  Resp 14  SpO2 10%  Post vital signs: Reviewed  Level of consciousness: sedated  Complications: No apparent anesthesia complications

## 2013-12-11 ENCOUNTER — Encounter (HOSPITAL_COMMUNITY): Payer: Self-pay | Admitting: Anesthesiology

## 2013-12-11 ENCOUNTER — Ambulatory Visit: Admit: 2013-12-11 | Payer: Self-pay | Admitting: Urology

## 2013-12-16 ENCOUNTER — Encounter (HOSPITAL_BASED_OUTPATIENT_CLINIC_OR_DEPARTMENT_OTHER): Payer: Self-pay | Admitting: *Deleted

## 2013-12-16 NOTE — Progress Notes (Signed)
NPO AFTER MN. ARRIVE AT 0600. NEEDS HG. MAY TAKE PAIN/ NAUSEA RX'S IF NEEDED AM DOS W/ SIPS OF WATER.

## 2013-12-23 ENCOUNTER — Ambulatory Visit (HOSPITAL_BASED_OUTPATIENT_CLINIC_OR_DEPARTMENT_OTHER)
Admission: RE | Admit: 2013-12-23 | Discharge: 2013-12-23 | Disposition: A | Discharge status: Home / Self Care | Payer: BC Managed Care – PPO | Source: Ambulatory Visit | Attending: Urology | Admitting: Urology

## 2013-12-23 ENCOUNTER — Encounter (HOSPITAL_BASED_OUTPATIENT_CLINIC_OR_DEPARTMENT_OTHER): Payer: Self-pay | Admitting: *Deleted

## 2013-12-23 ENCOUNTER — Encounter (HOSPITAL_BASED_OUTPATIENT_CLINIC_OR_DEPARTMENT_OTHER)
Admission: RE | Disposition: A | Discharge status: Home / Self Care | Payer: Self-pay | Source: Ambulatory Visit | Attending: Urology

## 2013-12-23 ENCOUNTER — Ambulatory Visit (HOSPITAL_BASED_OUTPATIENT_CLINIC_OR_DEPARTMENT_OTHER): Payer: BC Managed Care – PPO | Admitting: Anesthesiology

## 2013-12-23 ENCOUNTER — Encounter (HOSPITAL_BASED_OUTPATIENT_CLINIC_OR_DEPARTMENT_OTHER): Payer: BC Managed Care – PPO | Admitting: Anesthesiology

## 2013-12-23 DIAGNOSIS — N200 Calculus of kidney: Secondary | ICD-10-CM | POA: Insufficient documentation

## 2013-12-23 DIAGNOSIS — Z79899 Other long term (current) drug therapy: Secondary | ICD-10-CM | POA: Diagnosis not present

## 2013-12-23 DIAGNOSIS — N201 Calculus of ureter: Secondary | ICD-10-CM | POA: Diagnosis present

## 2013-12-23 HISTORY — DX: Hematuria, unspecified: R31.9

## 2013-12-23 HISTORY — DX: Calculus of kidney: N20.0

## 2013-12-23 HISTORY — PX: CYSTOSCOPY WITH URETEROSCOPY AND STENT PLACEMENT: SHX6377

## 2013-12-23 HISTORY — DX: Calculus of ureter: N20.1

## 2013-12-23 HISTORY — DX: Personal history of urinary calculi: Z87.442

## 2013-12-23 LAB — POCT HEMOGLOBIN-HEMACUE: Hemoglobin: 16.1 g/dL (ref 13.0–17.0)

## 2013-12-23 SURGERY — CYSTOURETEROSCOPY, WITH STENT INSERTION
Anesthesia: General | Site: Ureter | Laterality: Left

## 2013-12-23 MED ORDER — ACETAMINOPHEN 650 MG RE SUPP
650.0000 mg | RECTAL | Status: DC | PRN
  Filled 2013-12-23: qty 1

## 2013-12-23 MED ORDER — FENTANYL CITRATE 0.05 MG/ML IJ SOLN
INTRAMUSCULAR | Status: AC
  Filled 2013-12-23: qty 4

## 2013-12-23 MED ORDER — ONDANSETRON HCL 4 MG/2ML IJ SOLN
INTRAMUSCULAR | Status: DC | PRN
  Administered 2013-12-23: 4 mg via INTRAVENOUS

## 2013-12-23 MED ORDER — FENTANYL CITRATE 0.05 MG/ML IJ SOLN
INTRAMUSCULAR | Status: DC | PRN
  Administered 2013-12-23 (×2): 50 ug via INTRAVENOUS

## 2013-12-23 MED ORDER — OXYCODONE HCL 5 MG PO TABS
5.0000 mg | ORAL_TABLET | Freq: Once | ORAL | Status: AC | PRN
  Administered 2013-12-23: 5 mg via ORAL
  Filled 2013-12-23: qty 1

## 2013-12-23 MED ORDER — OXYCODONE-ACETAMINOPHEN 5-325 MG PO TABS
1.0000 | ORAL_TABLET | ORAL | Status: DC | PRN
Start: 2013-12-23 — End: 2016-01-10

## 2013-12-23 MED ORDER — ACETAMINOPHEN 10 MG/ML IV SOLN
INTRAVENOUS | Status: DC | PRN
  Administered 2013-12-23: 1000 mg via INTRAVENOUS

## 2013-12-23 MED ORDER — PROMETHAZINE HCL 25 MG/ML IJ SOLN
6.2500 mg | INTRAMUSCULAR | Status: DC | PRN
  Filled 2013-12-23: qty 1

## 2013-12-23 MED ORDER — LACTATED RINGERS IV SOLN
INTRAVENOUS | Status: DC
  Administered 2013-12-23: 07:00:00 via INTRAVENOUS
  Filled 2013-12-23: qty 1000

## 2013-12-23 MED ORDER — BELLADONNA ALKALOIDS-OPIUM 16.2-60 MG RE SUPP
RECTAL | Status: AC
  Filled 2013-12-23: qty 1

## 2013-12-23 MED ORDER — SODIUM CHLORIDE 0.9 % IJ SOLN
3.0000 mL | INTRAMUSCULAR | Status: DC | PRN
  Filled 2013-12-23: qty 3

## 2013-12-23 MED ORDER — IOHEXOL 350 MG/ML SOLN
INTRAVENOUS | Status: DC | PRN
  Administered 2013-12-23: 1 mL

## 2013-12-23 MED ORDER — PHENAZOPYRIDINE HCL 200 MG PO TABS
200.0000 mg | ORAL_TABLET | Freq: Three times a day (TID) | ORAL | Status: DC
  Administered 2013-12-23: 200 mg via ORAL
  Filled 2013-12-23: qty 1

## 2013-12-23 MED ORDER — MIDAZOLAM HCL 5 MG/5ML IJ SOLN
INTRAMUSCULAR | Status: DC | PRN
  Administered 2013-12-23: 2 mg via INTRAVENOUS

## 2013-12-23 MED ORDER — OXYCODONE HCL 5 MG PO TABS
ORAL_TABLET | ORAL | Status: AC
Start: 2013-12-23 — End: 2013-12-23
  Filled 2013-12-23: qty 1

## 2013-12-23 MED ORDER — LIDOCAINE HCL (CARDIAC) 20 MG/ML IV SOLN
INTRAVENOUS | Status: DC | PRN
  Administered 2013-12-23: 100 mg via INTRAVENOUS

## 2013-12-23 MED ORDER — PROPOFOL 10 MG/ML IV BOLUS
INTRAVENOUS | Status: DC | PRN
  Administered 2013-12-23: 300 mg via INTRAVENOUS

## 2013-12-23 MED ORDER — MEPERIDINE HCL 25 MG/ML IJ SOLN
6.2500 mg | INTRAMUSCULAR | Status: DC | PRN
  Filled 2013-12-23: qty 1

## 2013-12-23 MED ORDER — SODIUM CHLORIDE 0.9 % IJ SOLN
3.0000 mL | Freq: Two times a day (BID) | INTRAMUSCULAR | Status: DC
  Filled 2013-12-23: qty 3

## 2013-12-23 MED ORDER — PHENAZOPYRIDINE HCL 100 MG PO TABS
ORAL_TABLET | ORAL | Status: AC
  Filled 2013-12-23: qty 2

## 2013-12-23 MED ORDER — HYDROMORPHONE HCL PF 1 MG/ML IJ SOLN
0.2500 mg | INTRAMUSCULAR | Status: DC | PRN
  Administered 2013-12-23: 0.25 mg via INTRAVENOUS
  Filled 2013-12-23: qty 1

## 2013-12-23 MED ORDER — HYDROMORPHONE HCL PF 1 MG/ML IJ SOLN
INTRAMUSCULAR | Status: AC
  Filled 2013-12-23: qty 1

## 2013-12-23 MED ORDER — CIPROFLOXACIN IN D5W 400 MG/200ML IV SOLN
400.0000 mg | INTRAVENOUS | Status: AC
  Administered 2013-12-23: 400 mg via INTRAVENOUS
  Filled 2013-12-23: qty 200

## 2013-12-23 MED ORDER — ACETAMINOPHEN 325 MG PO TABS
650.0000 mg | ORAL_TABLET | ORAL | Status: DC | PRN
  Filled 2013-12-23: qty 2

## 2013-12-23 MED ORDER — KETOROLAC TROMETHAMINE 30 MG/ML IJ SOLN
INTRAMUSCULAR | Status: DC | PRN
  Administered 2013-12-23: 30 mg via INTRAVENOUS

## 2013-12-23 MED ORDER — SODIUM CHLORIDE 0.9 % IV SOLN
250.0000 mL | INTRAVENOUS | Status: DC | PRN
  Filled 2013-12-23: qty 250

## 2013-12-23 MED ORDER — SODIUM CHLORIDE 0.9 % IR SOLN
Status: DC | PRN
  Administered 2013-12-23: 4000 mL

## 2013-12-23 MED ORDER — OXYCODONE HCL 5 MG/5ML PO SOLN
5.0000 mg | Freq: Once | ORAL | Status: AC | PRN
  Filled 2013-12-23: qty 5

## 2013-12-23 MED ORDER — MIDAZOLAM HCL 2 MG/2ML IJ SOLN
INTRAMUSCULAR | Status: AC
  Filled 2013-12-23: qty 2

## 2013-12-23 MED ORDER — OXYCODONE HCL 5 MG PO TABS
5.0000 mg | ORAL_TABLET | ORAL | Status: DC | PRN
  Filled 2013-12-23: qty 2

## 2013-12-23 MED ORDER — DEXAMETHASONE SODIUM PHOSPHATE 4 MG/ML IJ SOLN
INTRAMUSCULAR | Status: DC | PRN
  Administered 2013-12-23: 10 mg via INTRAVENOUS

## 2013-12-23 MED ORDER — FENTANYL CITRATE 0.05 MG/ML IJ SOLN
25.0000 ug | INTRAMUSCULAR | Status: DC | PRN
  Filled 2013-12-23: qty 1

## 2013-12-23 SURGICAL SUPPLY — 42 items
BAG DRAIN URO-CYSTO SKYTR STRL (DRAIN) ×4 IMPLANT
BASKET LASER NITINOL 1.9FR (BASKET) IMPLANT
BASKET SEGURA 3FR (UROLOGICAL SUPPLIES) IMPLANT
BASKET STNLS GEMINI 4WIRE 3FR (BASKET) IMPLANT
BASKET STONE 1.7 NGAGE (UROLOGICAL SUPPLIES) ×4 IMPLANT
BASKET ZERO TIP NITINOL 2.4FR (BASKET) IMPLANT
CANISTER SUCT LVC 12 LTR MEDI- (MISCELLANEOUS) ×4 IMPLANT
CATH URET 5FR 28IN CONE TIP (BALLOONS)
CATH URET 5FR 28IN OPEN ENDED (CATHETERS) ×4 IMPLANT
CATH URET 5FR 70CM CONE TIP (BALLOONS) IMPLANT
CLOTH BEACON ORANGE TIMEOUT ST (SAFETY) ×4 IMPLANT
DRAPE CAMERA CLOSED 9X96 (DRAPES) ×4 IMPLANT
ELECT REM PT RETURN 9FT ADLT (ELECTROSURGICAL)
ELECTRODE REM PT RTRN 9FT ADLT (ELECTROSURGICAL) IMPLANT
FIBER LASER FLEXIVA 1000 (UROLOGICAL SUPPLIES) IMPLANT
FIBER LASER FLEXIVA 200 (UROLOGICAL SUPPLIES) IMPLANT
FIBER LASER FLEXIVA 365 (UROLOGICAL SUPPLIES) IMPLANT
FIBER LASER FLEXIVA 550 (UROLOGICAL SUPPLIES) IMPLANT
GLOVE BIOGEL M 6.5 STRL (GLOVE) ×8 IMPLANT
GLOVE BIOGEL PI IND STRL 7.5 (GLOVE) ×2 IMPLANT
GLOVE BIOGEL PI INDICATOR 7.5 (GLOVE) ×2
GLOVE SURG SS PI 8.0 STRL IVOR (GLOVE) ×8 IMPLANT
GOWN STRL NON-REIN LRG LVL3 (GOWN DISPOSABLE) IMPLANT
GOWN STRL REIN XL XLG (GOWN DISPOSABLE) ×4 IMPLANT
GOWN STRL REUS W/TWL LRG LVL3 (GOWN DISPOSABLE) ×4 IMPLANT
GOWN STRL REUS W/TWL XL LVL3 (GOWN DISPOSABLE) ×12 IMPLANT
GUIDEWIRE 0.038 PTFE COATED (WIRE) IMPLANT
GUIDEWIRE ANG ZIPWIRE 038X150 (WIRE) IMPLANT
GUIDEWIRE STR DUAL SENSOR (WIRE) ×4 IMPLANT
IV NS 1000ML (IV SOLUTION) ×2
IV NS 1000ML BAXH (IV SOLUTION) ×2 IMPLANT
IV NS IRRIG 3000ML ARTHROMATIC (IV SOLUTION) ×4 IMPLANT
KIT BALLIN UROMAX 15FX10 (LABEL) IMPLANT
KIT BALLN UROMAX 15FX4 (MISCELLANEOUS) IMPLANT
KIT BALLN UROMAX 26 75X4 (MISCELLANEOUS)
PACK CYSTOSCOPY (CUSTOM PROCEDURE TRAY) ×4 IMPLANT
SET HIGH PRES BAL DIL (LABEL)
SHEATH ACCESS URETERAL 38CM (SHEATH) ×4 IMPLANT
SHEATH ACCESS URETERAL 54CM (SHEATH) ×4 IMPLANT
STENT URET 6FRX26 CONTOUR (STENTS) ×4 IMPLANT
SYRINGE 60CC LL (MISCELLANEOUS) ×4 IMPLANT
WATER STERILE IRR 500ML POUR (IV SOLUTION) ×4 IMPLANT

## 2013-12-23 NOTE — Anesthesia Procedure Notes (Signed)
Procedure Name: LMA Insertion Date/Time: 12/23/2013 7:32 AM Performed by: Maris Berger T Pre-anesthesia Checklist: Patient identified, Emergency Drugs available, Suction available and Patient being monitored Patient Re-evaluated:Patient Re-evaluated prior to inductionOxygen Delivery Method: Circle System Utilized Preoxygenation: Pre-oxygenation with 100% oxygen Intubation Type: IV induction Ventilation: Mask ventilation without difficulty LMA: LMA inserted LMA Size: 5.0 Number of attempts: 1 Placement Confirmation: positive ETCO2 Dental Injury: Teeth and Oropharynx as per pre-operative assessment  Comments: Positioned in gap

## 2013-12-23 NOTE — Brief Op Note (Signed)
12/23/2013  8:28 AM  PATIENT:  [name]  47 y.o. male  PRE-OPERATIVE DIAGNOSIS:  LEFT URETERAL STONE   POST-OPERATIVE DIAGNOSIS:  LEFT Renal stones   PROCEDURE:  Procedure(s): LEFT URETEROSCOPY WITH STENT EXCHANGE, STONE EXTRACTION WITH BASKET (Left)  SURGEON:  Surgeon(s) and Role:    * Anner Crete, MD - Primary  PHYSICIAN ASSISTANT:   ASSISTANTS: none   ANESTHESIA:   general  EBL:  Total I/O In: 200 [I.V.:200] Out: -   BLOOD ADMINISTERED:none  DRAINS: 6 x 26 JJ stent   LOCAL MEDICATIONS USED:  NONE  SPECIMEN:  Source of Specimen:  stones from kidney  DISPOSITION OF SPECIMEN:  to patient to bring to office  COUNTS:  YES  TOURNIQUET:  * No tourniquets in log *  DICTATION: .Other Dictation: Dictation Number [id number]  PLAN OF CARE: Discharge to home after PACU  PATIENT DISPOSITION:  PACU - hemodynamically stable.   Delay start of Pharmacological VTE agent (>24hrs) due to surgical blood loss or risk of bleeding: not applicable

## 2013-12-23 NOTE — Anesthesia Postprocedure Evaluation (Signed)
Anesthesia Post Note  Patient: [name]  Procedure(s) Performed: Procedure(s) (LRB): LEFT URETEROSCOPY WITH STENT EXCHANGE, STONE EXTRACTION WITH BASKET (Left)  Anesthesia type: General  Patient location: PACU  Post pain: Pain level controlled  Post assessment: Post-op Vital signs reviewed  Last Vitals: BP 122/80  Pulse 53  Temp(Src) 36.7 C (Oral)  Resp 16  Ht  (1.753 m)  Wt 248 lb (112.492 kg)  BMI 36.61 kg/m2  SpO2 100%  Post vital signs: Reviewed  Level of consciousness: sedated  Complications: No apparent anesthesia complications

## 2013-12-23 NOTE — Op Note (Deleted)
NAME:  Gonzales, Kerry                 ACCOUNT NO.:  [id number]  MEDICAL RECORD NO.:  [id number]  LOCATION:                               FACILITY:  [facility]  PHYSICIAN:  Cheyne Bungert J. Talmadge Ganas, M.D.    DATE OF BIRTH:  10/24/1965  DATE OF PROCEDURE:  12/23/2013 DATE OF DISCHARGE:  12/23/2013                              OPERATIVE REPORT   PROCEDURE:  Cystoscopy with removal of left double-J stent, left ureteroscopic stone extraction, and insertion of left double-J stent.  PREOPERATIVE DIAGNOSIS:  Left ureteral stone.  POSTOPERATIVE DIAGNOSIS:  Left renal stone.  SURGEON:  Kuulei Kleier J. Rossie Bretado, MD  ANESTHESIA:  General.  SPECIMENS:  Two stones from the left kidney.  DRAINS:  A 6-French 26-cm double-J stent.  COMPLICATIONS:  None.  INDICATIONS:  [name] is a 48-year-old white male with a history of stones who was seen on December 08, 2013, with a 4-mm left proximal stone. He had some pyuria.  There was concern for infection so he underwent stenting with plan to return to the OR for definitive removal.  He reports he has had fairly significant hematuria and persistent pain since placement of the stent.  FINDINGS OF PROCEDURE:  He was given Cipro.  He was taken to the operating room where general anesthetic was induced.  He was placed in lithotomy position and fitted with PAS hose.  His perineum and genitalia were prepped with Betadine solution and draped in usual sterile fashion.  Cystoscopy was performed using a 22-French scope and 12-degree lens. 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  The external sphincter was intact.  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  Examination of the bladder was difficult due to turbid pre-stained urine and inspected thoroughly in his previous cysto.  The stent string was identified with excellent left ureteral orifice.  It was grasped with grasping forceps and moved to the urethral meatus.  There was some  encrustation of the stent old blood layered on the stent than is usual for this brief duration.  A guidewire was passed through the stent to the kidney under fluoroscopic guidance and the stent was removed.  Initially, a 12-French x38 cm introducer sheath inner core was passed to the kidney without difficulty.  This was followed by the assembled sheath.  The inner core and wire were then removed and the digital flexible ureteroscope was passed through the sheath to the kidney. Initially, visualization was difficult due to some old blood and pre- stained urine.  However, with copious irrigation, I was able to gradually clear the collecting system.  There appeared to be some irritation from the stent and the lower renal pelvis had been the source of the bleeding.  Initially, I found a stone in an upper pole calyx, it was approximately 4 mm.  This was removed with the N-gage basket but I felt this was an unlikely candidate for the ureteral stone since they generally fall into the lower pole and then bounce back to the kidney.  I did find the patient's CT scan and reviewed the films.  There had been a 4-mm upper pole stone in addition to the ureteral stone.    I then returned to the procedure and inspected the middle and finally the lower pole calyx which was difficult to access initially.  Once again, I had to clear out the old, bloody pre- stained urine.  Once this was done, a stone was identified in the lower pole calyx consistent with the ureteral stone, it was coated with old blood.  It was grasped with the N-Gage basket and removed intact.  At this point, further inspection revealed no additional stones other than a small Randall plaque or two.  The collecting system was irrigated copiously with the ureteroscope and then again with a 5-French open-end catheter and a 60 mL syringe under a vigorous flush.  This flushed out some small clots that I had not been able to flush out with  the ureteroscope.  A final inspection with the ureteroscope revealed minimal retained clot and no evidence of active bleeding.  The ureteroscope was removed while visualizing the ureter but about halfway down it appeared that there had been some ureteral irritation and that placement of a stent for a few days would be most appropriate.  So, guidewire was then passed through the sheath to the kidney.  The sheath was removed and a 6-French x 26-cm double-J stent with string was passed to the kidney under fluoroscopic guidance.  The wire was removed leaving good coil in the kidney and good coil in the bladder.  I used a flexible ureteroscope to inspect the distal loop of the stent and the bladder was in good position.  The ureteroscope was removed.  The stent string was left exiting the urethra and secured to the patient's penis with pink tape.  He was taken down from lithotomy position.  His anesthetic was reversed.  He was moved to recovery room in stable condition.  There were no complications.     [name]. [name], M.D.     JJW/MEDQ  D:  12/23/2013  T:  12/23/2013  Job:  [id number]

## 2013-12-23 NOTE — Transfer of Care (Signed)
Immediate Anesthesia Transfer of Care Note  Patient: [name]  Procedure(s) Performed: Procedure(s): LEFT URETEROSCOPY WITH STENT EXCHANGE, STONE EXTRACTION WITH BASKET (Left)  Patient Location: PACU  Anesthesia Type:General  Level of Consciousness: awake and oriented  Airway & Oxygen Therapy: Patient Spontanous Breathing and Patient connected to nasal cannula oxygen  Post-op Assessment: Report given to PACU RN  Post vital signs: Reviewed and stable  Complications: No apparent anesthesia complications

## 2013-12-23 NOTE — Discharge Instructions (Addendum)
Ureteral Stent Implantation, Care After Refer to this sheet in the next few weeks. These instructions provide you with information on caring for yourself after your procedure. Your health care provider may also give you more specific instructions. Your treatment has been planned according to current medical practices, but problems sometimes occur. Call your health care provider if you have any problems or questions after your procedure. WHAT TO EXPECT AFTER THE PROCEDURE You should be back to normal activity within 48 hours after the procedure. Nausea and vomiting may occur and are commonly the result of anesthesia. It is common to experience sharp pain in the back or lower abdomen and penis with voiding. This is caused by movement of the ends of the stent with the act of urinating.It usually goes away within minutes after you have stopped urinating. HOME CARE INSTRUCTIONS Make sure to drink plenty of fluids. You may have small amounts of bleeding, causing your urine to be red. This is normal. Certain movements may trigger pain or a feeling that you need to urinate. You may be given medicines to prevent infection or bladder spasms. Be sure to take all medicines as directed. Only take over-the-counter or prescription medicines for pain, discomfort, or fever as directed by your health care provider. Do not take aspirin, as this can make bleeding worse. Your stent will be left in until the blockage is resolved. This may take 2 weeks or longer, depending on the reason for stent implantation. You may have an X-ray exam to make sure your ureter is open and that the stent has not moved out of position (migrated). The stent can be removed by your health care provider in the office. Medicines may be given for comfort while the stent is being removed. Be sure to keep all follow-up appointments so your health care provider can check that you are healing properly. SEEK MEDICAL CARE IF:  You experience increasing  pain.  Your pain medicine is not working. SEEK IMMEDIATE MEDICAL CARE IF:  Your urine is dark red or has blood clots.  You are leaking urine (incontinent).  You have a fever, chills, feeling sick to your stomach (nausea), or vomiting.  Your pain is not relieved by pain medicine.  The end of the stent comes out of the urethra.  You are unable to urinate. Document Released: 12/18/2012 Document Revised: 04/22/2013 Document Reviewed: 12/18/2012 [facility] Patient Information 2015 [place], [place]. This information is not intended to replace advice given to you by your health care provider. Make sure you discuss any questions you have with your health care provider.   Post Anesthesia Home Care Instructions  Activity: Get plenty of rest for the remainder of the day. A responsible adult should stay with you for 24 hours following the procedure.  For the next 24 hours, DO NOT: -Drive a car -Advertising copywriter -Drink alcoholic beverages -Take any medication unless instructed by your physician -Make any legal decisions or sign important papers.  Meals: Start with liquid foods such as gelatin or soup. Progress to regular foods as tolerated. Avoid greasy, spicy, heavy foods. If nausea and/or vomiting occur, drink only clear liquids until the nausea and/or vomiting subsides. Call your physician if vomiting continues.  Special Instructions/Symptoms: Your throat may feel dry or sore from the anesthesia or the breathing tube placed in your throat during surgery. If this causes discomfort, gargle with warm salt water. The discomfort should disappear within 24 hours. You may remove the stent by pulling the string on Friday morning.  If  You don't feel comfortable doing that you can come to the office.

## 2013-12-23 NOTE — Interval H&P Note (Signed)
History and Physical Interval Note:  He had a stent placed but I couldn't access the stone.  He returns today for stone removal.   12/23/2013 7:20 AM  [name]  has presented today for surgery, with the diagnosis of LEFT URETERAL STONE   The various methods of treatment have been discussed with the patient and family. After consideration of risks, benefits and other options for treatment, the patient has consented to  Procedure(s): LEFT URETEROSCOPY AND POSSIBLE STENT PLACEMENT (Left) WITH HOLMIUM LASER (Left) as a surgical intervention .  The patient's history has been reviewed, patient examined, no change in status, stable for surgery.  I have reviewed the patient's chart and labs.  Questions were answered to the patient's satisfaction.     Rajean Desantiago J

## 2013-12-23 NOTE — Op Note (Signed)
[name], [name]                 ACCOUNT NO.:  [id number]  MEDICAL RECORD NO.:  [id number]  LOCATION:                               FACILITY:  [facility]  PHYSICIAN:  [name]. [name], M.D.    DATE OF BIRTH:  [birth year]  DATE OF PROCEDURE:  12/23/2013 DATE OF DISCHARGE:  12/23/2013                              OPERATIVE REPORT   PROCEDURE:  Cystoscopy with removal of left double-J stent, left ureteroscopic stone extraction, and insertion of left double-J stent.  PREOPERATIVE DIAGNOSIS:  Left ureteral stone.  POSTOPERATIVE DIAGNOSIS:  Left renal stone.  SURGEON:  [name]. [name], MD  ANESTHESIA:  General.  SPECIMENS:  Two stones from the left kidney.  DRAINS:  A 6-French 26-cm double-J stent.  COMPLICATIONS:  None.  INDICATIONS:  [name] is a 48 year old white male with a history of stones who was seen on December 08, 2013, with a 4-mm left proximal stone. He had some pyuria.  There was concern for infection so he underwent stenting with plan to return to the OR for definitive removal.  He reports he has had fairly significant hematuria and persistent pain since placement of the stent.  FINDINGS OF PROCEDURE:  He was given Cipro.  He was taken to the operating room where general anesthetic was induced.  He was placed in lithotomy position and fitted with PAS hose.  His perineum and genitalia were prepped with Betadine solution and draped in usual sterile fashion.  Cystoscopy was performed using a 22-French scope and 12-degree lens. 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  The external sphincter was intact.  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  Examination of the bladder was difficult due to turbid pre-stained urine and inspected thoroughly in his previous cysto.  The stent string was identified with excellent left ureteral orifice.  It was grasped with grasping forceps and moved to the urethral meatus.  There was some  encrustation of the stent old blood layered on the stent than is usual for this brief duration.  A guidewire was passed through the stent to the kidney under fluoroscopic guidance and the stent was removed.  Initially, a 12-French x38 cm introducer sheath inner core was passed to the kidney without difficulty.  This was followed by the assembled sheath.  The inner core and wire were then removed and the digital flexible ureteroscope was passed through the sheath to the kidney. Initially, visualization was difficult due to some old blood and pre- stained urine.  However, with copious irrigation, I was able to gradually clear the collecting system.  There appeared to be some irritation from the stent and the lower renal pelvis had been the source of the bleeding.  Initially, I found a stone in an upper pole calyx, it was approximately 4 mm.  This was removed with the N-gage basket but I felt this was an unlikely candidate for the ureteral stone since they generally fall into the lower pole and then bounce back to the kidney.  I did find the patient's CT scan and reviewed the films.  There had been a 4-mm upper pole stone in addition to the ureteral stone.  I then returned to the procedure and inspected the middle and finally the lower pole calyx which was difficult to access initially.  Once again, I had to clear out the old, bloody pre- stained urine.  Once this was done, a stone was identified in the lower pole calyx consistent with the ureteral stone, it was coated with old blood.  It was grasped with the N-Gage basket and removed intact.  At this point, further inspection revealed no additional stones other than a small Randall plaque or two.  The collecting system was irrigated copiously with the ureteroscope and then again with a 5-French open-end catheter and a 60 mL syringe under a vigorous flush.  This flushed out some small clots that I had not been able to flush out with  the ureteroscope.  A final inspection with the ureteroscope revealed minimal retained clot and no evidence of active bleeding.  The ureteroscope was removed while visualizing the ureter but about halfway down it appeared that there had been some ureteral irritation and that placement of a stent for a few days would be most appropriate.  So, guidewire was then passed through the sheath to the kidney.  The sheath was removed and a 6-French x 26-cm double-J stent with string was passed to the kidney under fluoroscopic guidance.  The wire was removed leaving good coil in the kidney and good coil in the bladder.  I used a flexible ureteroscope to inspect the distal loop of the stent and the bladder was in good position.  The ureteroscope was removed.  The stent string was left exiting the urethra and secured to the patient's penis with pink tape.  He was taken down from lithotomy position.  His anesthetic was reversed.  He was moved to recovery room in stable condition.  There were no complications.     [name]. [name], M.D.     JJW/MEDQ  D:  12/23/2013  T:  12/23/2013  Job:  [id number]

## 2013-12-23 NOTE — H&P (View-Only) (Signed)
Subjective: [name] is a 48 yo WM who I was asked to see in consultation by Dr. Littie Deeds for a 4mm left ureteral stone with obstruction and a possible UTI.   He had a stone treated with ureteroscopy last year in [place] on the right and he had to have a stent for a possible stricture.   His pain was severe this morning with nausea.  He had no voiding complaints or hematuria.  He has had no fever but his urine looked infected and he is tachycardic with an elevated Cr.    He has had no other GU history.  ROS:  Review of Systems  Constitutional: Negative for fever and chills.  Gastrointestinal: Positive for nausea.  Genitourinary: Positive for flank pain (right).  All other systems reviewed and are negative.  No Known Allergies  Past Medical History  Diagnosis Date  . Kidney stones     Past Surgical History  Procedure Laterality Date  . Lithotripsy    . Cystoscopy w/ ureteroscopy w/ lithotripsy      History   Social History  . Marital Status: Single    Spouse Name: N/A    Number of Children: N/A  . Years of Education: N/A   Occupational History  . machine operator    Social History Main Topics  . Smoking status: Never Smoker   . Smokeless tobacco: Never Used  . Alcohol Use: No  . Drug Use: No  . Sexual Activity: Not on file   Other Topics Concern  . Not on file   Social History Narrative  . No narrative on file    History reviewed. No pertinent family history.  Anti-infectives: Anti-infectives   None      Current Facility-Administered Medications  Medication Dose Route Frequency Provider Last Rate Last Dose  . HYDROmorphone (DILAUDID) injection 1 mg  1 mg Intravenous Q30 min PRN Mirian Mo, MD   1 mg at 12/08/13 1844   Current Outpatient Prescriptions  Medication Sig Dispense Refill  . cephALEXin (KEFLEX) 500 MG capsule Take 500 mg by mouth 4 (four) times daily.      . minocycline (DYNACIN) 100 MG tablet Take 100 mg by mouth 2 (two) times daily.          Objective: Vital signs in last 24 hours: Pulse Rate:  [75] 75 (08/10 1828) Resp:  [18] 18 (08/10 1828) BP: (120)/(80) 120/80 mmHg (08/10 1828) SpO2:  [99 %-100 %] 100 % (08/10 1828)  Intake/Output from previous day:   Intake/Output this shift:     Physical Exam  Constitutional: He is oriented to person, place, and time and well-developed, well-nourished, and in no distress.  HENT:  Head: Normocephalic and atraumatic.  Neck: Normal range of motion. Neck supple.  Cardiovascular: Regular rhythm.   tachycardia  Pulmonary/Chest: Effort normal and breath sounds normal. No respiratory distress.  Abdominal: Soft. Bowel sounds are normal. There is tenderness (left CVAT moderate).  Musculoskeletal: Normal range of motion. He exhibits no edema.  Neurological: He is alert and oriented to person, place, and time.  Skin: Skin is warm and dry.  Psychiatric: Mood and affect normal.   His Cr is 1.7 and his WBC is 13.4.   The UA is nit - with 14 RBC's and 59 WBC's.    I have reviewed his CT films which show a 4mm obstructing left proximal stone with renal stones.   Assessment: He has a proximal left ureteral stone with ARI and a possible UTI.  Plan: I am going to take him for cystoscopy and stenting tonight and will then have him return for ureteroscopy at a later date.   The risks were reviewed in detail.   CC: Dr. Judie Petit. Littie Deeds.     LOS: 0 days    Jayce Boyko J 12/08/2013

## 2013-12-23 NOTE — Anesthesia Preprocedure Evaluation (Signed)
Anesthesia Evaluation  Patient identified by MRN, date of birth, ID band Patient awake    Reviewed: Allergy & Precautions, H&P , NPO status , Patient's Chart, lab work & pertinent test results  Airway Mallampati: II TM Distance: >3 FB Neck ROM: Full    Dental no notable dental hx.    Pulmonary neg pulmonary ROS,  breath sounds clear to auscultation  Pulmonary exam normal       Cardiovascular negative cardio ROS  Rhythm:Regular Rate:Normal     Neuro/Psych negative neurological ROS  negative psych ROS   GI/Hepatic negative GI ROS, Neg liver ROS,   Endo/Other  Morbid obesity  Renal/GU Renal disease     Musculoskeletal negative musculoskeletal ROS (+)   Abdominal   Peds  Hematology negative hematology ROS (+)   Anesthesia Other Findings   Reproductive/Obstetrics negative OB ROS                           Anesthesia Physical  Anesthesia Plan  ASA: II  Anesthesia Plan: General   Post-op Pain Management:    Induction: Intravenous  Airway Management Planned: LMA  Additional Equipment:   Intra-op Plan:   Post-operative Plan: Extubation in OR  Informed Consent: I have reviewed the patients History and Physical, chart, labs and discussed the procedure including the risks, benefits and alternatives for the proposed anesthesia with the patient or authorized representative who has indicated his/her understanding and acceptance.   Dental advisory given  Plan Discussed with: CRNA  Anesthesia Plan Comments:         Anesthesia Quick Evaluation

## 2013-12-24 ENCOUNTER — Encounter (HOSPITAL_BASED_OUTPATIENT_CLINIC_OR_DEPARTMENT_OTHER): Payer: Self-pay | Admitting: Urology

## 2016-01-10 ENCOUNTER — Encounter: Payer: Self-pay | Admitting: Emergency Medicine

## 2016-01-10 ENCOUNTER — Emergency Department: Payer: Self-pay

## 2016-01-10 ENCOUNTER — Emergency Department
Admission: EM | Admit: 2016-01-10 | Discharge: 2016-01-10 | Disposition: A | Discharge status: Home / Self Care | Payer: Self-pay | Attending: Emergency Medicine | Admitting: Emergency Medicine

## 2016-01-10 DIAGNOSIS — Z792 Long term (current) use of antibiotics: Secondary | ICD-10-CM | POA: Insufficient documentation

## 2016-01-10 DIAGNOSIS — Z79899 Other long term (current) drug therapy: Secondary | ICD-10-CM | POA: Insufficient documentation

## 2016-01-10 DIAGNOSIS — N201 Calculus of ureter: Secondary | ICD-10-CM | POA: Insufficient documentation

## 2016-01-10 DIAGNOSIS — R109 Unspecified abdominal pain: Secondary | ICD-10-CM

## 2016-01-10 LAB — BASIC METABOLIC PANEL
Anion gap: 5 (ref 5–15)
BUN: 17 mg/dL (ref 6–20)
CO2: 25 mmol/L (ref 22–32)
Calcium: 9.1 mg/dL (ref 8.9–10.3)
Chloride: 106 mmol/L (ref 101–111)
Creatinine, Ser: 1.16 mg/dL (ref 0.61–1.24)
GFR calc Af Amer: >60 mL/min (ref >60)
GLUCOSE: 104 mg/dL — AB (ref 65–99)
POTASSIUM: 4.3 mmol/L (ref 3.5–5.1)
Sodium: 136 mmol/L (ref 135–145)

## 2016-01-10 LAB — CBC WITH DIFFERENTIAL/PLATELET
Basophils Absolute: 0.1 10*3/uL (ref 0–0.1)
Basophils Relative: 1 %
Eosinophils Absolute: 0.2 10*3/uL (ref 0–0.7)
Eosinophils Relative: 2 %
HEMATOCRIT: 46 % (ref 40.0–52.0)
Hemoglobin: 16.2 g/dL (ref 13.0–18.0)
LYMPHS PCT: 28 %
Lymphs Abs: 2.9 10*3/uL (ref 1.0–3.6)
MCH: 31.5 pg (ref 26.0–34.0)
MCHC: 35.2 g/dL (ref 32.0–36.0)
MCV: 89.5 fL (ref 80.0–100.0)
MONO ABS: 1.2 10*3/uL — AB (ref 0.2–1.0)
MONOS PCT: 12 %
Neutro Abs: 5.9 10*3/uL (ref 1.4–6.5)
Neutrophils Relative %: 57 %
PLATELETS: 234 10*3/uL (ref 150–440)
RBC: 5.14 MIL/uL (ref 4.40–5.90)
RDW: 13.7 % (ref 11.5–14.5)
WBC: 10.4 10*3/uL (ref 3.8–10.6)

## 2016-01-10 LAB — URINALYSIS COMPLETE WITH MICROSCOPIC (ARMC ONLY)
Bacteria, UA: NONE SEEN
Bilirubin Urine: NEGATIVE
Glucose, UA: NEGATIVE mg/dL
Ketones, ur: NEGATIVE mg/dL
NITRITE: NEGATIVE
PH: 5 (ref 5.0–8.0)
Protein, ur: NEGATIVE mg/dL
SPECIFIC GRAVITY, URINE: 1.021 (ref 1.005–1.030)

## 2016-01-10 IMAGING — CT CT RENAL STONE PROTOCOL
2 of 4 series · 16 of 46 positions shown, 18 images · non-contrast
Comparison: 12/08/2013 CT abdomen and pelvis.

CLINICAL DATA: 50 y/o  M; acute flank pain.

EXAM:
CT ABDOMEN AND PELVIS WITHOUT CONTRAST
TECHNIQUE: Multidetector CT imaging of the abdomen and pelvis was performed
following the standard protocol without IV contrast.

[Series 2: axial st · axial · 0.79mm/px · z∈[-490,-35]mm · 13 of 99 slices shown, 15 images]
[im 4/99  soft-tissue]
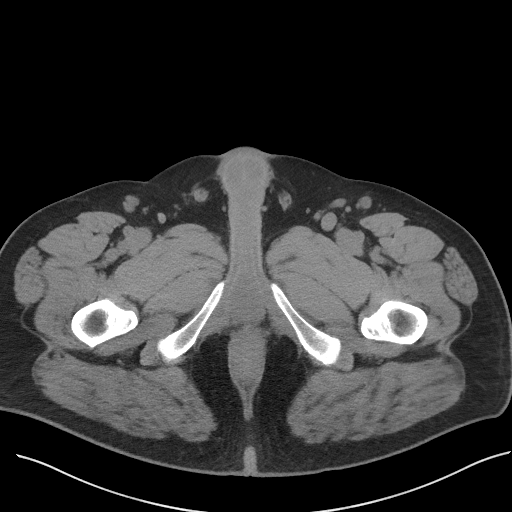
[im 4/99  bone]
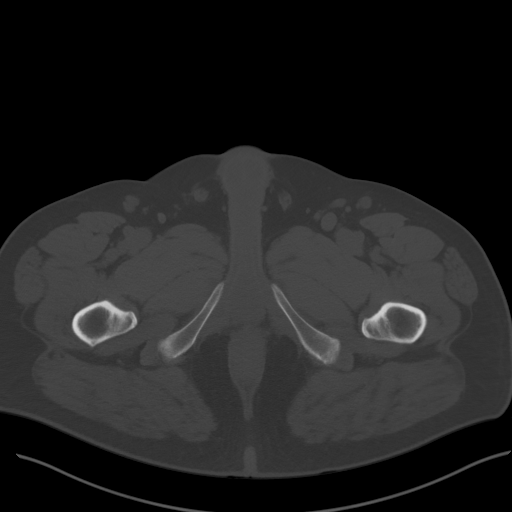
[im 12/99  soft-tissue]
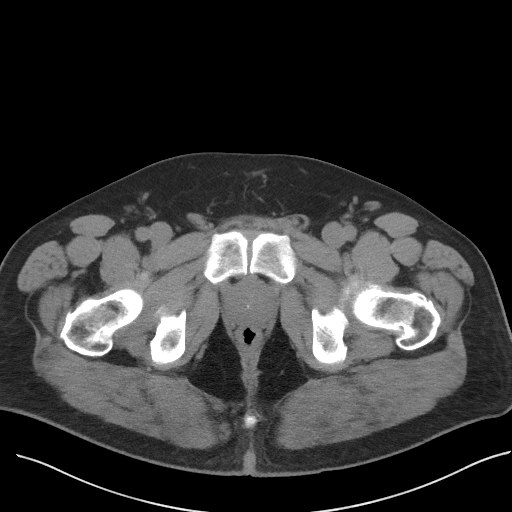
[im 20/99  soft-tissue]
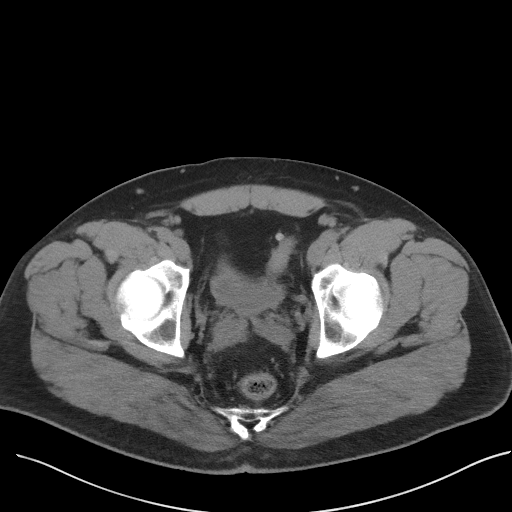
[im 28/99  soft-tissue]
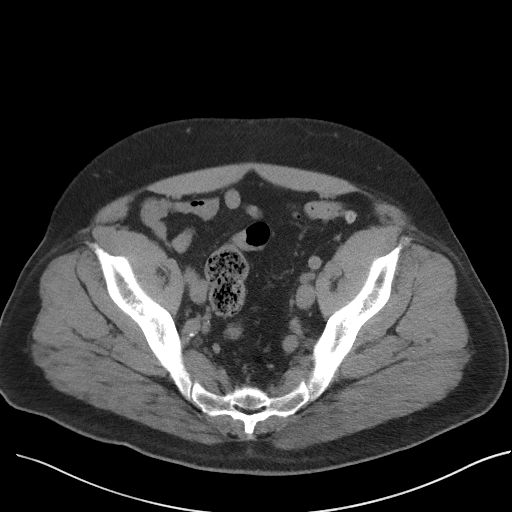
[im 36/99  soft-tissue]
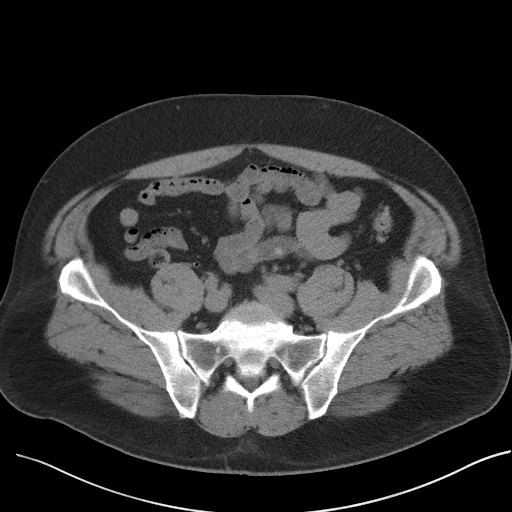
[im 44/99  soft-tissue]
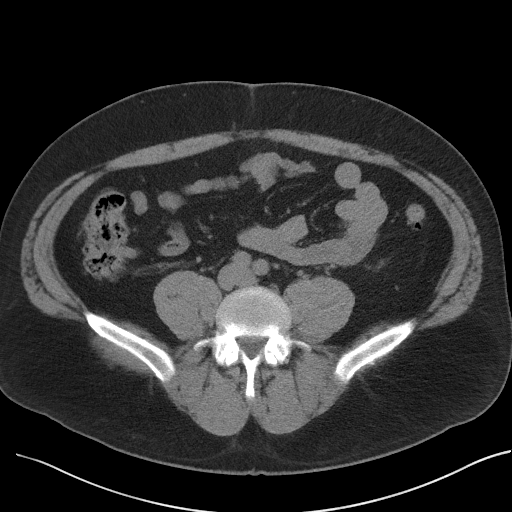
[im 51/99  soft-tissue]
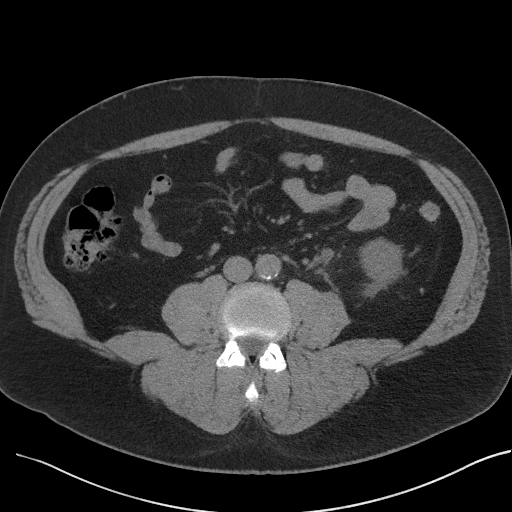
[im 55/99  soft-tissue]
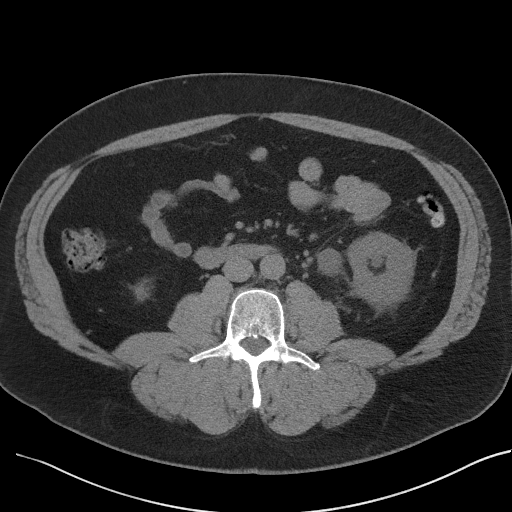
[im 63/99  soft-tissue]
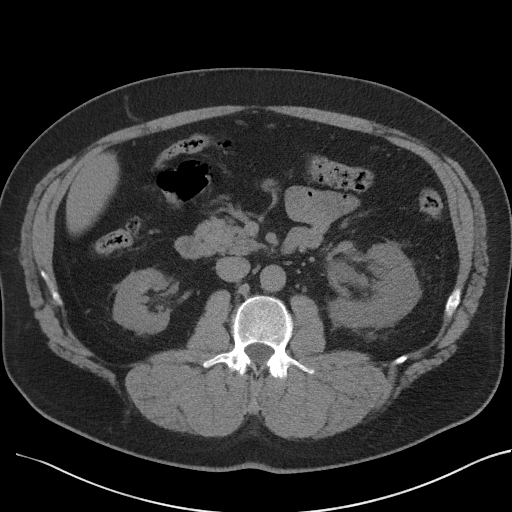
[im 63/99  bone]
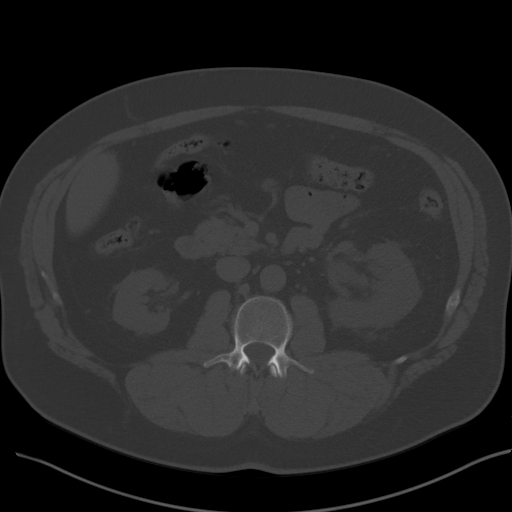
[im 71/99  soft-tissue]
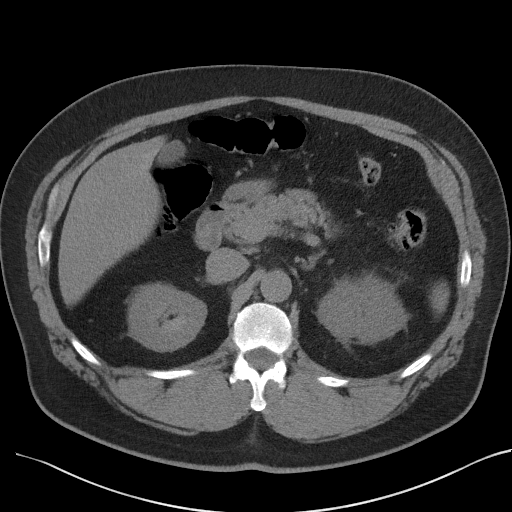
[im 79/99  soft-tissue]
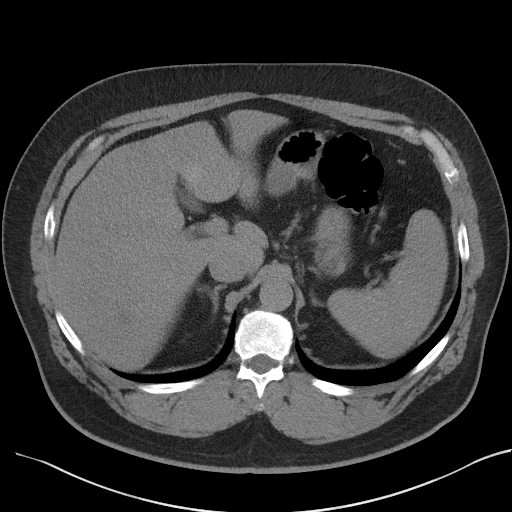
[im 87/99  soft-tissue]
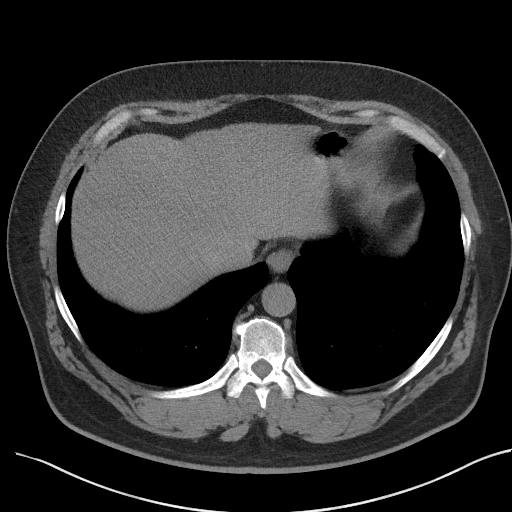
[im 95/99  soft-tissue]
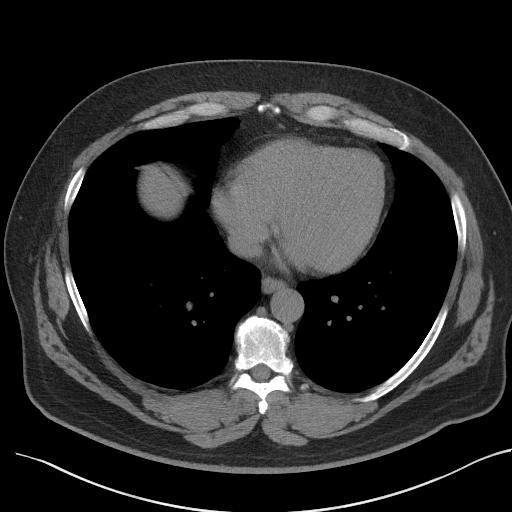

[Series 5: coronal · coronal · 0.86mm/px · 3 of 156 slices shown]
[im 52/156  soft-tissue]
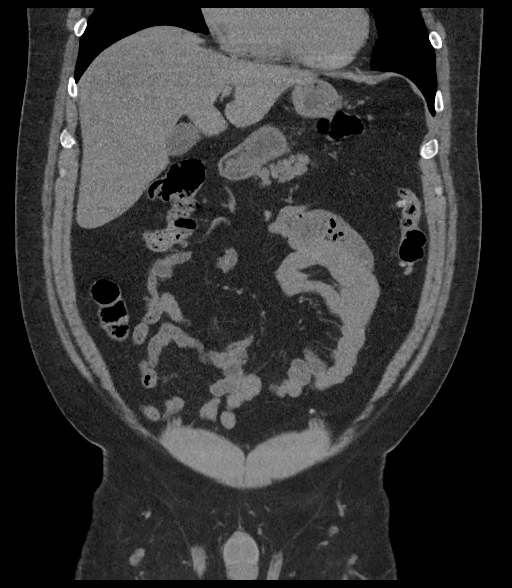
[im 69/156  soft-tissue]
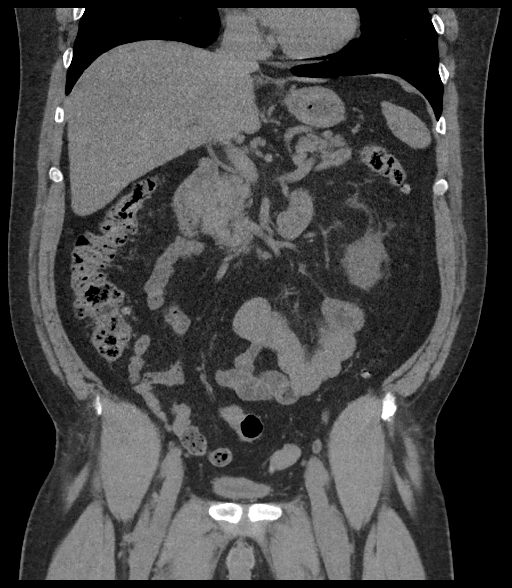
[im 87/156  soft-tissue]
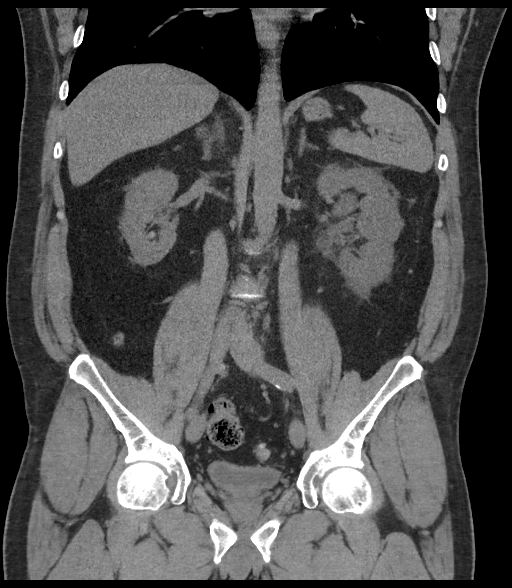

[16 of 46 positions shown; findings below may reference images not displayed]

FINDINGS: Lower chest: No acute abnormality.

Hepatobiliary: No focal liver abnormality is seen. No gallstones,
gallbladder wall thickening, or biliary dilatation. Stable 14 mm
lucency in right hepatic lobe with fluid attenuation, possibly cyst
or hemangioma.

Pancreas: Unremarkable. No pancreatic ductal dilatation or
surrounding inflammatory changes.

Spleen: Normal in size without focal abnormality.

Adrenals/Urinary Tract: Moderate left kidney hydronephrosis
secondary to a 4 mm obstructing stone within the mid ureter at the
L4 level. Moderate left-sided perinephric stranding.

The renal collecting system downstream to the obstructing stone is
not dilated. Normal right kidney and collecting system.

Stomach/Bowel: Stomach is within normal limits. No evidence of bowel
wall thickening, distention, or inflammatory changes. Appendix
appears normal. Sigmoid diverticulosis without evidence of
diverticulitis.

Vascular/Lymphatic: Minimal aortic atherosclerosis. No enlarged
abdominal or pelvic lymph nodes.

Reproductive: Prostate calcifications noted.

Other: Small fat containing paraumbilical hernia.  No ascites.

Musculoskeletal: No acute or significant osseous findings.
IMPRESSION: Left mid ureter 4 mm obstructing stone with moderate proximal
hydronephrosis and left-sided perinephric stranding.

By: Smandari Alchik M.D.

## 2016-01-10 MED ORDER — KETOROLAC TROMETHAMINE 30 MG/ML IJ SOLN
30.0000 mg | Freq: Once | INTRAMUSCULAR | Status: AC
  Administered 2016-01-10: 30 mg via INTRAMUSCULAR
  Filled 2016-01-10: qty 1

## 2016-01-10 MED ORDER — ONDANSETRON HCL 4 MG PO TABS: 4.0000 mg | ORAL_TABLET | Freq: Every day | ORAL | 1 refills | Status: DC | PRN

## 2016-01-10 MED ORDER — TAMSULOSIN HCL 0.4 MG PO CAPS: 0.4000 mg | ORAL_CAPSULE | Freq: Every day | ORAL | 0 refills | Status: DC

## 2016-01-10 MED ORDER — OXYCODONE-ACETAMINOPHEN 5-325 MG PO TABS: 1.0000 | ORAL_TABLET | Freq: Four times a day (QID) | ORAL | 0 refills | Status: DC | PRN

## 2016-01-10 MED ORDER — NAPROXEN 500 MG PO TABS
500.0000 mg | ORAL_TABLET | Freq: Two times a day (BID) | ORAL | 2 refills | Status: DC
Start: 2016-01-10 — End: 2016-01-24

## 2016-01-10 MED ORDER — NAPROXEN 500 MG PO TABS: 500.0000 mg | ORAL_TABLET | Freq: Two times a day (BID) | ORAL | 2 refills | Status: DC

## 2016-01-10 NOTE — ED Triage Notes (Signed)
Pt to ed with c/o left flank pain since yesterday.  Hx of kidney stones.

## 2016-01-10 NOTE — ED Provider Notes (Addendum)
[facility] Emergency Department Provider Note   ____________________________________________    I have reviewed the triage vital signs and the nursing notes.   HISTORY  Chief Complaint Flank Pain     HPI [name] is a 50 y.o. male who p/w sharp left flank pain. Patient reports a hx of kidney stones and he reports this feels similar. He notes he has had flank pain for months but it has worsened recently. No n/v. No fevers or chills. He has been in prison recently. No hematuria or burning with urination. No dizziness. He has taken motrin with some relief   Past Medical History:  Diagnosis Date  . Hematuria   . History of kidney stones   . Left ureteral calculus   . Nephrolithiasis     There are no active problems to display for this patient.   Past Surgical History:  Procedure Laterality Date  . CYSTO/  URETEROSCOPY LASER LITHOTRIPSY WITH STONE EXTRACTION/  STENT PLACEMENT Right 2014   (raliegh)  . CYSTOSCOPY WITH URETEROSCOPY AND STENT PLACEMENT Left 12/23/2013   Procedure: LEFT URETEROSCOPY WITH STENT EXCHANGE, STONE EXTRACTION WITH BASKET;  Surgeon: Anner CreteJohn J Wrenn, MD;  [facility];  Service: Urology;  Laterality: Left;  . CYSTOSCOPY/RETROGRADE/URETEROSCOPY Left 12/08/2013   Procedure: CYSTOSCOPY/LEFT RETROGRADE PYELOGRAM/ INSERTION LEFT URETERAL STENT;  Surgeon: Anner CreteJohn J Wrenn, MD;  Location: [facility];  Service: Urology;  Laterality: Left;    Prior to Admission medications   Medication Sig Start Date End Date Taking? Authorizing Provider  ciprofloxacin (CIPRO) 500 MG tablet Take 1 tablet (500 mg total) by mouth 2 (two) times daily. 12/08/13   Bjorn PippinJohn Wrenn, MD  Multiple Vitamin (MULTIVITAMIN WITH MINERALS) TABS tablet Take 1 tablet by mouth daily.    Historical Provider, MD  naproxen (NAPROSYN) 500 MG tablet Take 1 tablet (500 mg total) by mouth 2 (two) times daily with a meal. 01/10/16   Jene Everyobert Verlaine Embry, MD  naproxen sodium  (ANAPROX) 220 MG tablet Take 440 mg by mouth 2 (two) times daily as needed (pain).    Historical Provider, MD  ondansetron (ZOFRAN) 4 MG tablet Take 1 tablet (4 mg total) by mouth daily as needed for nausea or vomiting. 01/10/16   Jene Everyobert Ayelet Gruenewald, MD  oxyCODONE-acetaminophen (ROXICET) 5-325 MG tablet Take 1 tablet by mouth every 6 (six) hours as needed. 01/10/16 01/09/17  Jene Everyobert Rainer Mounce, MD  phenazopyridine (PYRIDIUM) 200 MG tablet Take 1 tablet (200 mg total) by mouth 3 (three) times daily as needed for pain. 12/08/13   Bjorn PippinJohn Wrenn, MD  promethazine (PHENERGAN) 25 MG tablet Take 1 tablet (25 mg total) by mouth every 6 (six) hours as needed for nausea or vomiting. 12/08/13   Bjorn PippinJohn Wrenn, MD  tamsulosin (FLOMAX) 0.4 MG CAPS capsule Take 1 capsule (0.4 mg total) by mouth daily. 01/10/16   Jene Everyobert Glorian Mcdonell, MD     Allergies Review of patient's allergies indicates no known allergies.  History reviewed. No pertinent family history.  Social History Social History  Substance Use Topics  . Smoking status: Never Smoker  . Smokeless tobacco: Never Used  . Alcohol use No    Review of Systems  Constitutional: No fever/chills   Cardiovascular: Denies chest pain. Respiratory: Denies cough or pleurisy Gastrointestinal: as above Genitourinary: Negative for dysuria.no hematura Musculoskeletal: Negative for back pain. Skin: Negative for rash. Neurological: Negative for headaches   10-point ROS otherwise negative.  ____________________________________________   PHYSICAL EXAM:  VITAL SIGNS: ED Triage Vitals  Enc Vitals Group     BP 01/10/16 1403 (!) 148/100     Pulse Rate 01/10/16 1403 76     Resp 01/10/16 1403 18     Temp 01/10/16 1403 97.8 F (36.6 C)     Temp Source 01/10/16 1403 Oral     SpO2 01/10/16 1403 98 %     Weight 01/10/16 1336 250 lb (113.4 kg)     Height 01/10/16 1336 5\' 8"  (1.727 m)     Head Circumference --      Peak Flow --      Pain Score 01/10/16 1336 10     Pain Loc --       Pain Edu? --      Excl. in GC? --     Constitutional: Alert and oriented. No acute distress. Pleasant and interactive Eyes: Conjunctivae are normal.   Nose: No congestion/rhinnorhea. Mouth/Throat: Mucous membranes are moist.    Cardiovascular: Normal rate, regular rhythm.   Good peripheral circulation. Respiratory: Normal respiratory effort.  No retractions.  Gastrointestinal: Soft and nontender. No distention.  No CVA tenderness. Genitourinary: deferred Musculoskeletal: No lower extremity tenderness nor edema.  Warm and well perfused Neurologic:  Normal speech and language. No gross focal neurologic deficits are appreciated.  Skin:  Skin is warm, dry and intact. No rash noted. Psychiatric: Mood and affect are normal. Speech and behavior are normal.  ____________________________________________   LABS (all labs ordered are listed, but only abnormal results are displayed)  Labs Reviewed  URINALYSIS COMPLETEWITH MICROSCOPIC ([facility] ONLY) - Abnormal; Notable for the following:       Result Value   Color, Urine YELLOW (*)    APPearance CLEAR (*)    Hgb urine dipstick 2+ (*)    Leukocytes, UA TRACE (*)    Squamous Epithelial / LPF 0-5 (*)    All other components within normal limits  BASIC METABOLIC PANEL - Abnormal; Notable for the following:    Glucose, Bld 104 (*)    All other components within normal limits  CBC WITH DIFFERENTIAL/PLATELET - Abnormal; Notable for the following:    Monocytes Absolute 1.2 (*)    All other components within normal limits   ____________________________________________  EKG   ____________________________________________  RADIOLOGY  Ct renal stone study shows 4 mm left mid ureter stone ____________________________________________   PROCEDURES  Procedure(s) performed: No    Critical Care performed: No ____________________________________________   INITIAL IMPRESSION / ASSESSMENT AND PLAN / ED COURSE  Pertinent labs & imaging  results that were available during my care of the patient were reviewed by me and considered in my medical decision making (see chart for details).  Pt with left flank pain, hx of kidney stones. We will check urine, give toradol IM and obtain ct renal stone study. No fevers or chills. Vitals unremakable. Labs reassuring. No signs of infection on UA  Clinical Course  ct c/w ureterolithiasis. Patient improved after tx. Will d/c with uro f/u with analgesics. Return precautions d/w patient ____________________________________________   FINAL CLINICAL IMPRESSION(S) / ED DIAGNOSES  Final diagnoses:  Flank pain, acute  Ureterolithiasis      NEW MEDICATIONS STARTED DURING THIS VISIT:  New Prescriptions   NAPROXEN (NAPROSYN) 500 MG TABLET    Take 1 tablet (500 mg total) by mouth 2 (two) times daily with a meal.   ONDANSETRON (ZOFRAN) 4 MG TABLET    Take 1 tablet (4 mg total) by mouth daily as needed for nausea or vomiting.   OXYCODONE-ACETAMINOPHEN (ROXICET)  5-325 MG TABLET    Take 1 tablet by mouth every 6 (six) hours as needed.   TAMSULOSIN (FLOMAX) 0.4 MG CAPS CAPSULE    Take 1 capsule (0.4 mg total) by mouth daily.     Note:  This document was prepared using Dragon voice recognition software and may include unintentional dictation errors.    Jene Every, MD 01/10/16 1614    Jene Every, MD 01/10/16 [phone]

## 2016-01-24 ENCOUNTER — Encounter: Payer: Self-pay | Admitting: *Deleted

## 2016-01-24 ENCOUNTER — Emergency Department: Payer: Self-pay

## 2016-01-24 ENCOUNTER — Emergency Department
Admission: EM | Admit: 2016-01-24 | Discharge: 2016-01-24 | Disposition: A | Discharge status: Home / Self Care | Payer: Self-pay | Attending: Emergency Medicine | Admitting: Emergency Medicine

## 2016-01-24 DIAGNOSIS — M1712 Unilateral primary osteoarthritis, left knee: Secondary | ICD-10-CM | POA: Insufficient documentation

## 2016-01-24 DIAGNOSIS — M25562 Pain in left knee: Secondary | ICD-10-CM

## 2016-01-24 IMAGING — DX DG KNEE COMPLETE 4+V*L*
4 series · 4 of 4 positions shown · non-contrast
Comparison: None.

CLINICAL DATA: Pt complains of left knee pain with swelling for 2
days, pt denies injury to knee Pt complains of left knee pain with
swelling for 2 days, pt denies injury to knee

EXAM:
LEFT KNEE - COMPLETE 4+ VIEW

[knee ap]
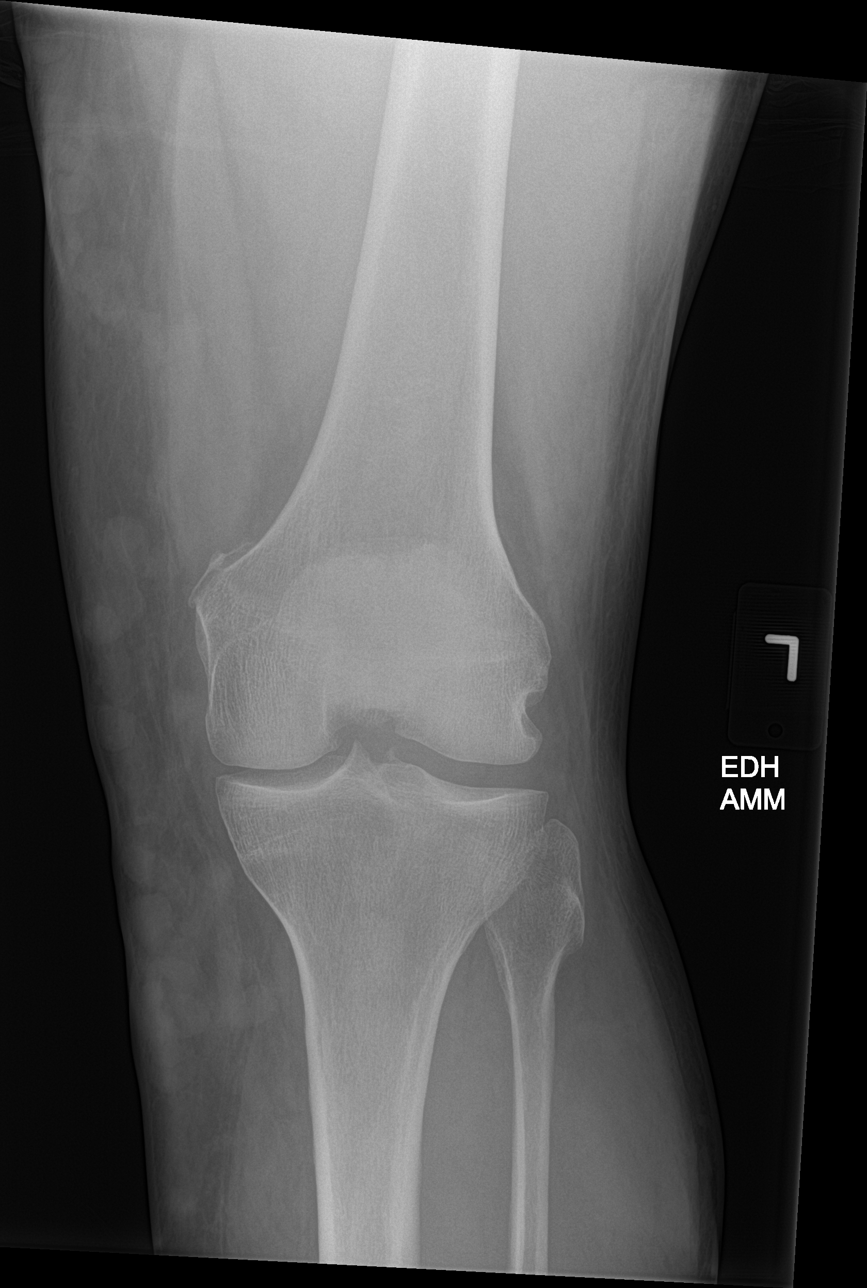

[knee lat]
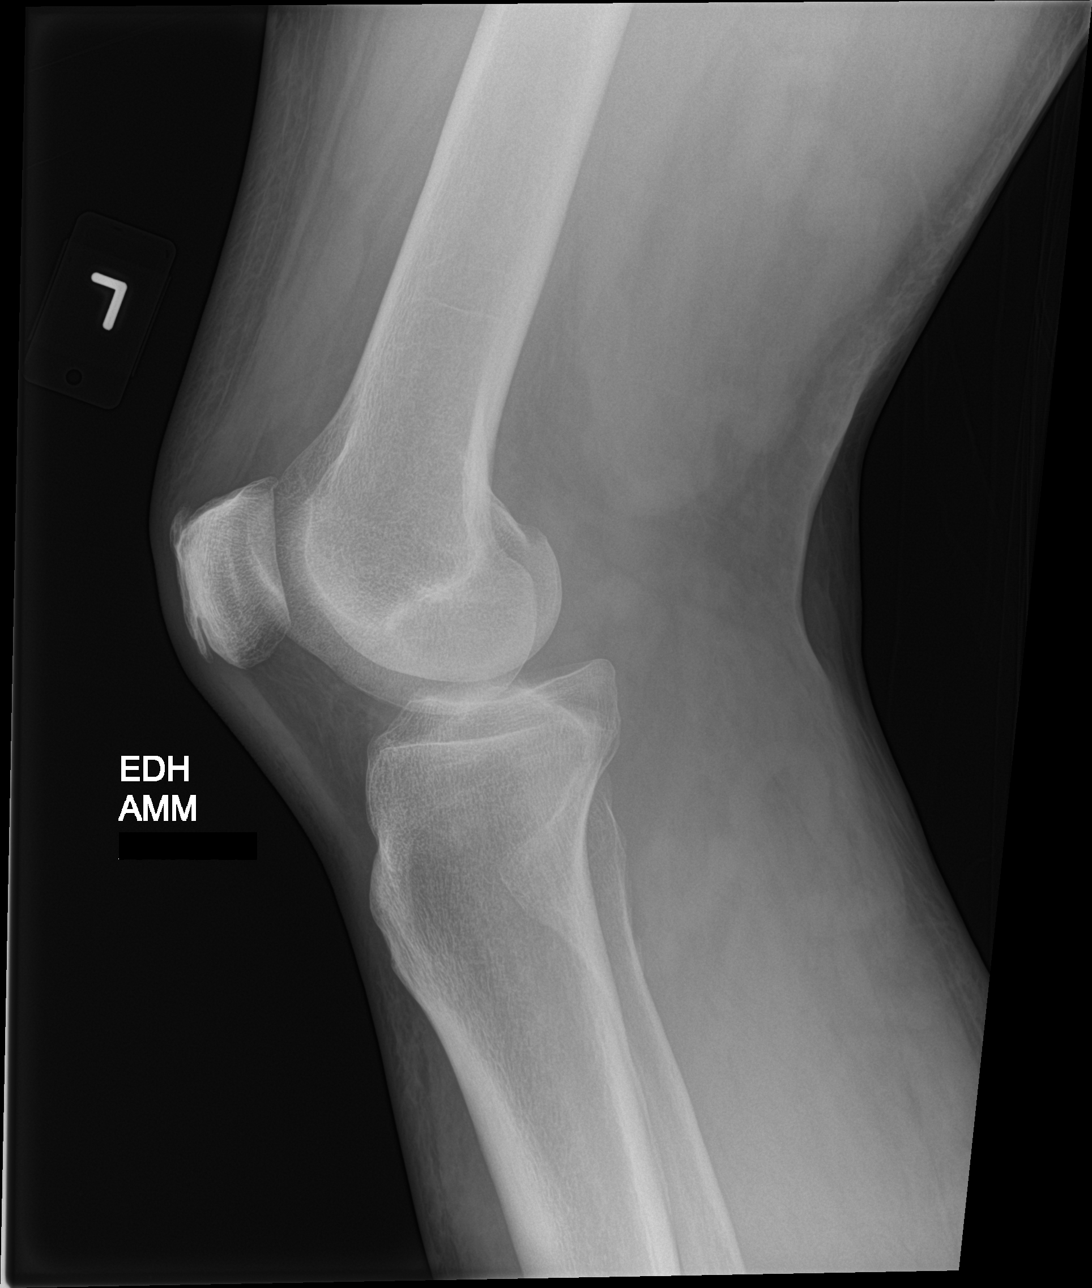

[knee obl (1 of 2)]
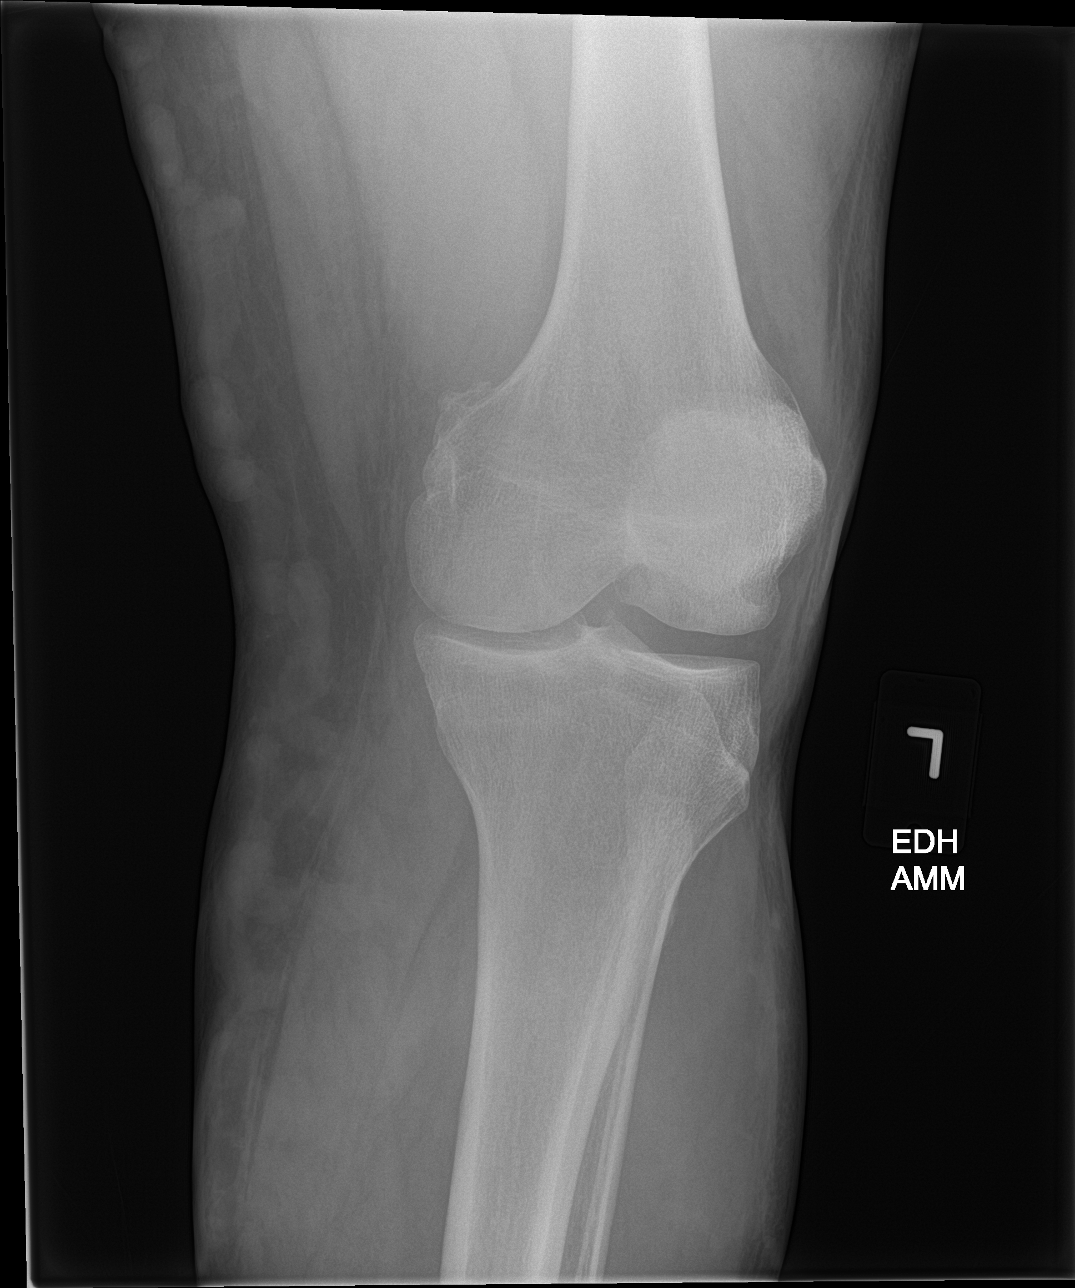

[knee obl (2 of 2)]
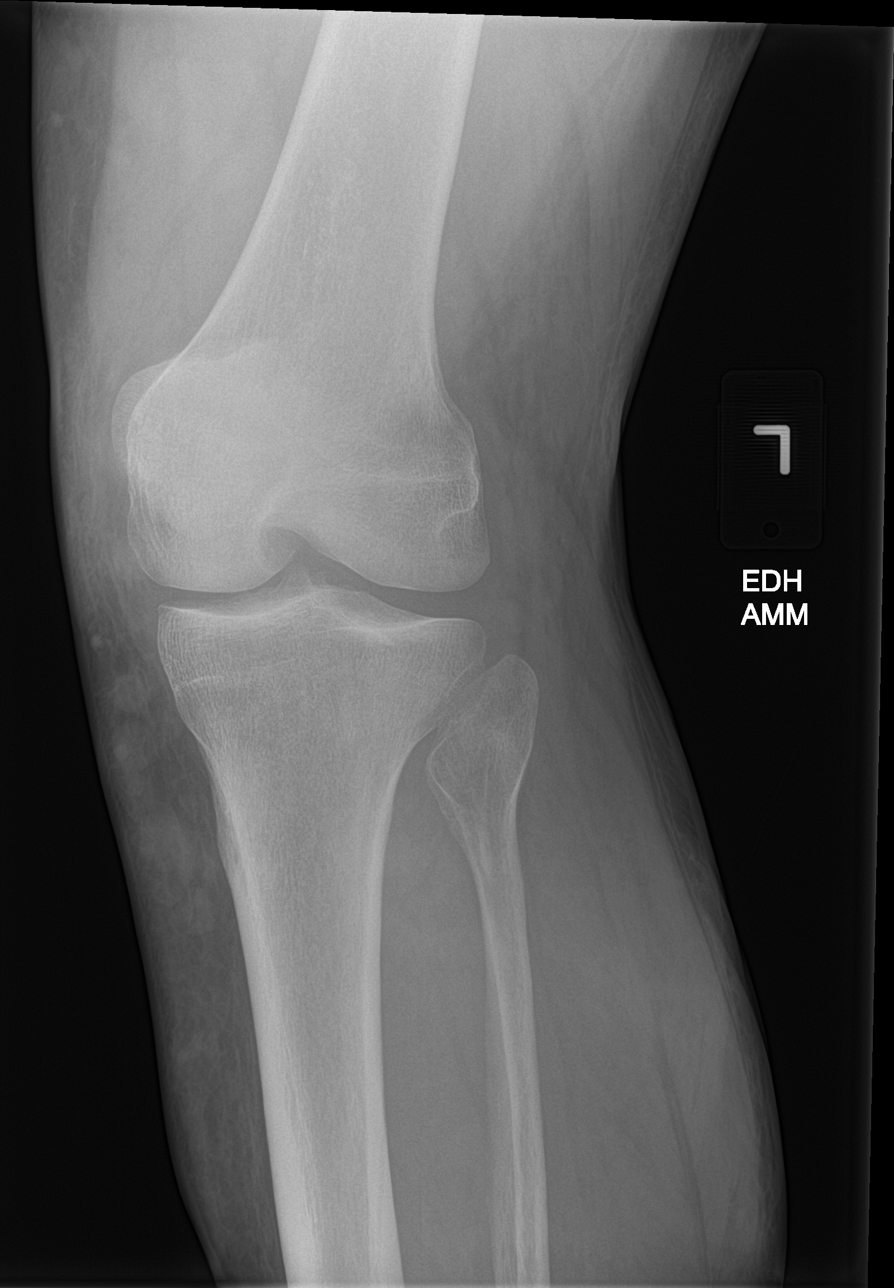

[4 of 4 positions shown; findings below may reference images not displayed]

FINDINGS: Enthesophytes at the quadriceps insertion and patellar tendon
origin. No acute fracture or dislocation. No joint effusion. Osseous
irregularity at the origin of the medial collateral ligament
suggests remote trauma.
IMPRESSION: No acute osseous abnormality.

## 2016-01-24 MED ORDER — NAPROXEN 500 MG PO TABS: 500.0000 mg | ORAL_TABLET | Freq: Two times a day (BID) | ORAL | 0 refills | Status: DC

## 2016-01-24 NOTE — ED Provider Notes (Signed)
**Note Kerry-Identified via Obfuscation** [facility] Emergency Department Provider Note  ____________________________________________   First MD Initiated Contact with Patient 01/24/16 1352     (approximate)  I have reviewed the triage vital signs and the nursing notes.   HISTORY  Chief Complaint Knee Pain   HPI [name] is a 50 y.o. male is here with complaint of left knee pain. Patient states that he is not aware of any injury to his knee. He states that over the weekend he was crawling under a house a great deal of time but did not have pain at this time. He states that he has had problems with his knee in the past and has had "fluid" on his knee. He states that today it feels like "it is going out". He denies any previous surgeries to his knee. He has not been taking any over-the-counter medication. Patient has continued to be ambulatory despite his knee pain.   Past Medical History:  Diagnosis Date  . Hematuria   . History of kidney stones   . Left ureteral calculus   . Nephrolithiasis     There are no active problems to display for this patient.   Past Surgical History:  Procedure Laterality Date  . CYSTO/  URETEROSCOPY LASER LITHOTRIPSY WITH STONE EXTRACTION/  STENT PLACEMENT Right 2014   (raliegh)  . CYSTOSCOPY WITH URETEROSCOPY AND STENT PLACEMENT Left 12/23/2013   Procedure: LEFT URETEROSCOPY WITH STENT EXCHANGE, STONE EXTRACTION WITH BASKET;  Surgeon: Anner Crete, MD;  Location: [facility];  Service: Urology;  Laterality: Left;  . CYSTOSCOPY/RETROGRADE/URETEROSCOPY Left 12/08/2013   Procedure: CYSTOSCOPY/LEFT RETROGRADE PYELOGRAM/ INSERTION LEFT URETERAL STENT;  Surgeon: Anner Crete, MD;  Location: [facility];  Service: Urology;  Laterality: Left;    Prior to Admission medications   Medication Sig Start Date End Date Taking? Authorizing Provider  ciprofloxacin (CIPRO) 500 MG tablet Take 1 tablet (500 mg total) by mouth 2 (two) times daily. 12/08/13   Bjorn Pippin,  MD  Multiple Vitamin (MULTIVITAMIN WITH MINERALS) TABS tablet Take 1 tablet by mouth daily.    Historical Provider, MD  naproxen (NAPROSYN) 500 MG tablet Take 1 tablet (500 mg total) by mouth 2 (two) times daily with a meal. 01/24/16   Tommi Rumps, PA-C  naproxen sodium (ANAPROX) 220 MG tablet Take 440 mg by mouth 2 (two) times daily as needed (pain).    Historical Provider, MD  ondansetron (ZOFRAN) 4 MG tablet Take 1 tablet (4 mg total) by mouth daily as needed for nausea or vomiting. 01/10/16   Jene Every, MD  oxyCODONE-acetaminophen (ROXICET) 5-325 MG tablet Take 1 tablet by mouth every 6 (six) hours as needed. 01/10/16 01/09/17  Jene Every, MD  phenazopyridine (PYRIDIUM) 200 MG tablet Take 1 tablet (200 mg total) by mouth 3 (three) times daily as needed for pain. 12/08/13   Bjorn Pippin, MD  promethazine (PHENERGAN) 25 MG tablet Take 1 tablet (25 mg total) by mouth every 6 (six) hours as needed for nausea or vomiting. 12/08/13   Bjorn Pippin, MD  tamsulosin (FLOMAX) 0.4 MG CAPS capsule Take 1 capsule (0.4 mg total) by mouth daily. 01/10/16   Jene Every, MD    Allergies Review of patient's allergies indicates no known allergies.  No family history on file.  Social History Social History  Substance Use Topics  . Smoking status: Never Smoker  . Smokeless tobacco: Never Used  . Alcohol use No    Review of Systems Constitutional: No fever/chills Cardiovascular:  Denies chest pain. Respiratory: Denies shortness of breath. Gastrointestinal:  No nausea, no vomiting.   Genitourinary: Negative for dysuria. Musculoskeletal: Negative for back pain. Positive for left knee pain. Skin: Negative for rash. Neurological: Negative for headaches, focal weakness or numbness.  10-point ROS otherwise negative.  ____________________________________________   PHYSICAL EXAM:  VITAL SIGNS: ED Triage Vitals  Enc Vitals Group     BP 01/24/16 1338 128/90     Pulse Rate 01/24/16 1338 88     Resp  01/24/16 1338 20     Temp 01/24/16 1338 97.8 F (36.6 C)     Temp Source 01/24/16 1338 Oral     SpO2 01/24/16 1338 100 %     Weight 01/24/16 1339 250 lb (113.4 kg)     Height 01/24/16 1339 5\' 8"  (1.727 m)     Head Circumference --      Peak Flow --      Pain Score 01/24/16 1339 8     Pain Loc --      Pain Edu? --      Excl. in GC? --     Constitutional: Alert and oriented. Well appearing and in no acute distress. Eyes: Conjunctivae are normal. PERRL. EOMI. Head: Atraumatic. Nose: No congestion/rhinnorhea. Neck: No stridor.   Cardiovascular: Normal rate, regular rhythm. Grossly normal heart sounds.  Good peripheral circulation. Respiratory: Normal respiratory effort.  No retractions. Lungs CTAB. Musculoskeletal: Left knee with degenerative changes. There is moderate tenderness on palpation of the anterior portion of the left knee. There is no effusion present. Range of motion is with crepitus. Ligaments are stable bilaterally. There is no erythema or warmth to the area. Palpation of the posterior aspect of left knee is nontender. There is a small ecchymotic area on the medial portion of the anterior knee that patient was unaware of. Neurologic:  Normal speech and language. No gross focal neurologic deficits are appreciated.  Skin:  Skin is warm, dry and intact. No rash noted. Psychiatric: Mood and affect are normal. Speech and behavior are normal.  ____________________________________________   LABS (all labs ordered are listed, but only abnormal results are displayed)  Labs Reviewed - No data to display   RADIOLOGY Per radiologist on x-ray of left knee FINDINGS:  Enthesophytes at the quadriceps insertion and patellar tendon  origin. No acute fracture or dislocation. No joint effusion. Osseous  irregularity at the origin of the medial collateral ligament  suggests remote trauma.   I, Tommi Rumpshonda L Nazir Hacker, personally viewed and evaluated these images (plain radiographs) as part  of my medical decision making, as well as reviewing the written report by the radiologist. ____________________________________________   PROCEDURES  Procedure(s) performed: None  Procedures  Critical Care performed: No  ____________________________________________   INITIAL IMPRESSION / ASSESSMENT AND PLAN / ED COURSE  Pertinent labs & imaging results that were available during my care of the patient were reviewed by me and considered in my medical decision making (see chart for details).   Clinical Course   Patient was placed in knee immobilizer for support. Patient was given prescriptions for naproxen 500 mg twice a day with food. He is follow-up with Dr. Martha ClanKrasinski if any continued problems with his knee.  ____________________________________________   FINAL CLINICAL IMPRESSION(S) / ED DIAGNOSES  Final diagnoses:  Knee pain, acute, left  Osteoarthritis of left knee, unspecified osteoarthritis type      NEW MEDICATIONS STARTED DURING THIS VISIT:  Discharge Medication List as of 01/24/2016  3:06 PM  Note:  This document was prepared using Dragon voice recognition software and may include unintentional dictation errors.    Tommi Rumps, PA-C 01/24/16 1732    Myrna Blazer, MD 01/25/16 2300

## 2016-01-24 NOTE — Discharge Instructions (Signed)
Follow-up with Dr. Martha ClanKrasinski if any continued problems with your knee. Take naproxen 500 mg twice a day with food. Wear knee immobilizer for support.

## 2016-01-24 NOTE — ED Triage Notes (Signed)
Pt complains of left knee pain with swelling for 2 days, pt denies injury to knee

## 2016-06-26 ENCOUNTER — Emergency Department
Admission: EM | Admit: 2016-06-26 | Discharge: 2016-06-26 | Disposition: A | Discharge status: Home / Self Care | Payer: Self-pay | Attending: Emergency Medicine | Admitting: Emergency Medicine

## 2016-06-26 ENCOUNTER — Emergency Department: Payer: Self-pay

## 2016-06-26 ENCOUNTER — Encounter: Payer: Self-pay | Admitting: Emergency Medicine

## 2016-06-26 DIAGNOSIS — N23 Unspecified renal colic: Secondary | ICD-10-CM | POA: Insufficient documentation

## 2016-06-26 DIAGNOSIS — Z79899 Other long term (current) drug therapy: Secondary | ICD-10-CM | POA: Insufficient documentation

## 2016-06-26 DIAGNOSIS — R109 Unspecified abdominal pain: Secondary | ICD-10-CM

## 2016-06-26 LAB — BASIC METABOLIC PANEL
ANION GAP: 5 (ref 5–15)
BUN: 17 mg/dL (ref 6–20)
CHLORIDE: 109 mmol/L (ref 101–111)
CO2: 22 mmol/L (ref 22–32)
Calcium: 8.2 mg/dL — ABNORMAL LOW (ref 8.9–10.3)
Creatinine, Ser: 0.96 mg/dL (ref 0.61–1.24)
GFR calc non Af Amer: >60 mL/min (ref >60)
Glucose, Bld: 99 mg/dL (ref 65–99)
POTASSIUM: 3.9 mmol/L (ref 3.5–5.1)
Sodium: 136 mmol/L (ref 135–145)

## 2016-06-26 LAB — CBC
HEMATOCRIT: 42.7 % (ref 40.0–52.0)
HEMOGLOBIN: 15.1 g/dL (ref 13.0–18.0)
MCH: 33.1 pg (ref 26.0–34.0)
MCHC: 35.4 g/dL (ref 32.0–36.0)
MCV: 93.6 fL (ref 80.0–100.0)
Platelets: 215 10*3/uL (ref 150–440)
RBC: 4.56 MIL/uL (ref 4.40–5.90)
RDW: 13.4 % (ref 11.5–14.5)
WBC: 8.4 10*3/uL (ref 3.8–10.6)

## 2016-06-26 LAB — URINALYSIS, COMPLETE (UACMP) WITH MICROSCOPIC
BACTERIA UA: NONE SEEN
Bilirubin Urine: NEGATIVE
Glucose, UA: NEGATIVE mg/dL
Ketones, ur: NEGATIVE mg/dL
NITRITE: NEGATIVE
Protein, ur: NEGATIVE mg/dL
SPECIFIC GRAVITY, URINE: 1.018 (ref 1.005–1.030)
pH: 5 (ref 5.0–8.0)

## 2016-06-26 IMAGING — CR DG ABDOMEN 1V
2 series · 2 of 2 positions shown · non-contrast
Comparison: CT stone study January 10, 2016

CLINICAL DATA: Left flank pain for the past 3 days. History of
kidney stones and hematuria and previous lithotripsy.

EXAM:
ABDOMEN - 1 VIEW

[abdomen kub (1 of 2)]
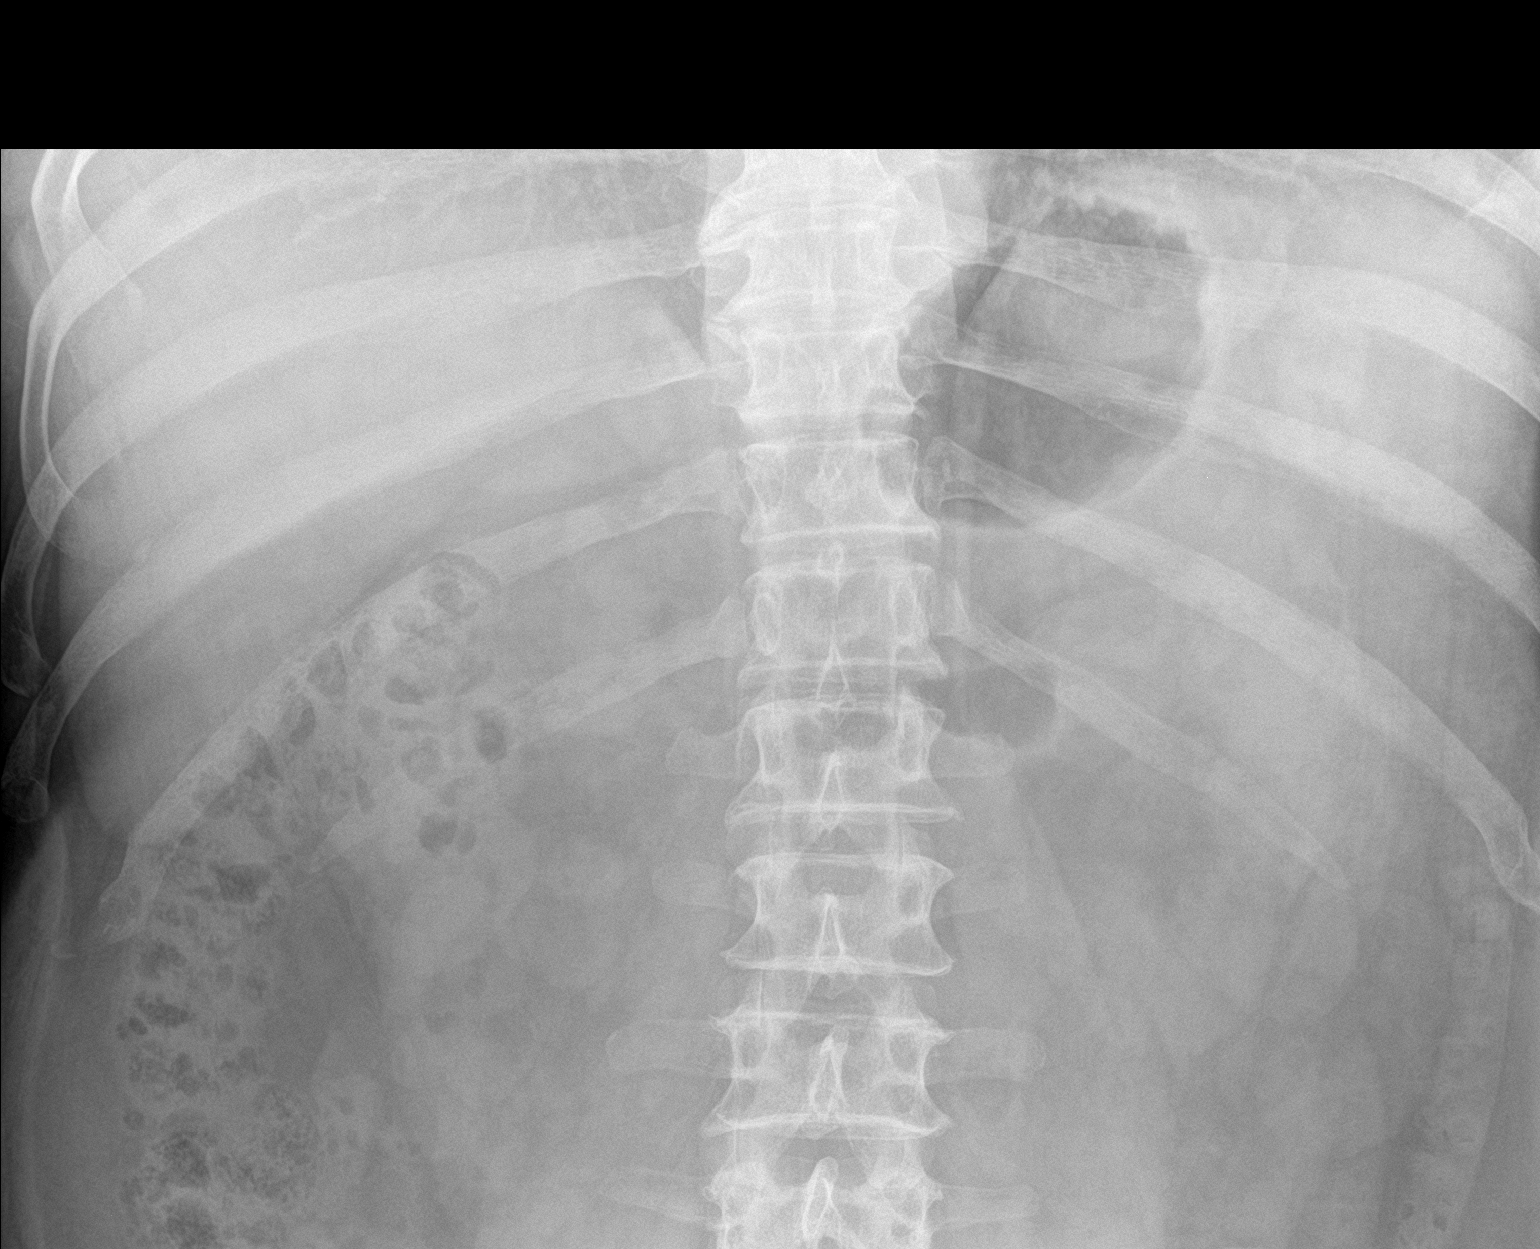

[abdomen kub (2 of 2)]
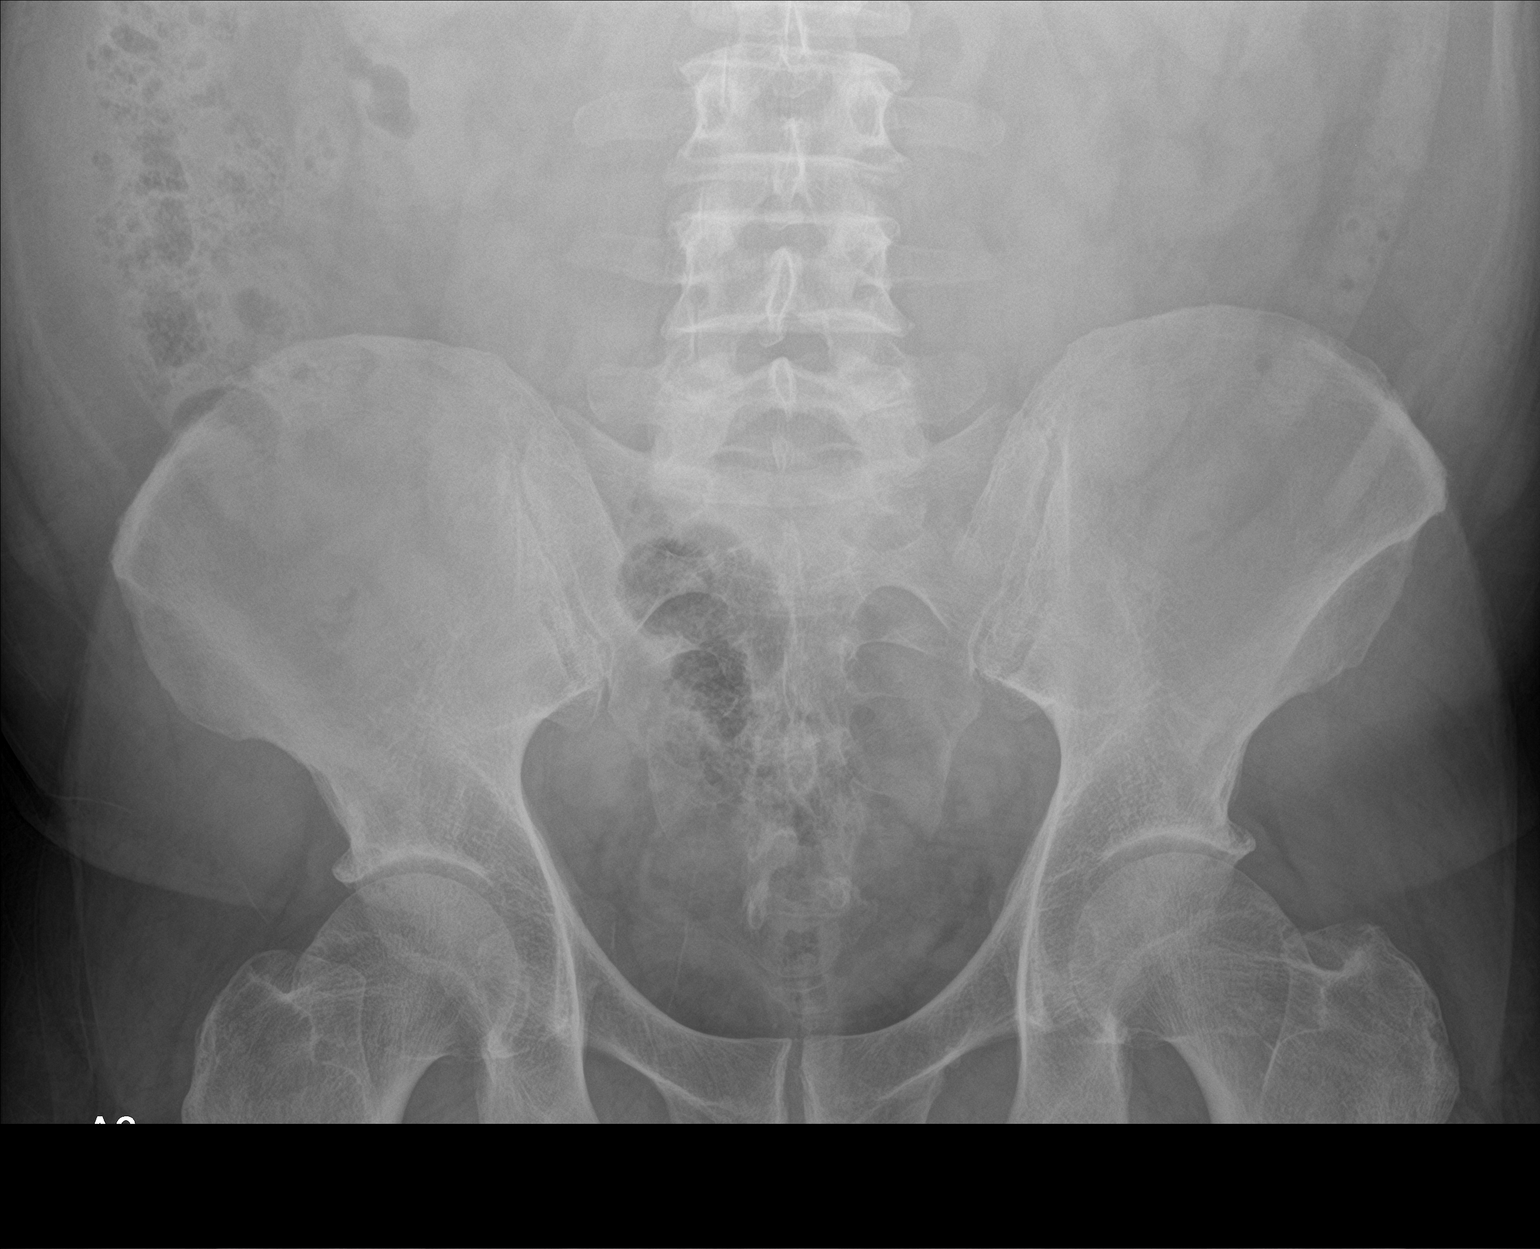

[2 of 2 positions shown; findings below may reference images not displayed]

FINDINGS: The bowel gas pattern is normal. No definite kidney or ureteral
stones are observed. No bladder stones are demonstrated. The bony
structures are unremarkable.
IMPRESSION: No calcified urinary tract stone is observed on today's study.

## 2016-06-26 MED ORDER — CEPHALEXIN 500 MG PO CAPS
500.0000 mg | ORAL_CAPSULE | Freq: Once | ORAL | Status: AC
  Administered 2016-06-26: 500 mg via ORAL
  Filled 2016-06-26: qty 1

## 2016-06-26 MED ORDER — CEPHALEXIN 500 MG PO CAPS: 500.0000 mg | ORAL_CAPSULE | Freq: Three times a day (TID) | ORAL | 0 refills | Status: AC

## 2016-06-26 MED ORDER — KETOROLAC TROMETHAMINE 30 MG/ML IJ SOLN
15.0000 mg | Freq: Once | INTRAMUSCULAR | Status: AC
  Administered 2016-06-26: 15 mg via INTRAVENOUS
  Filled 2016-06-26: qty 1

## 2016-06-26 MED ORDER — SODIUM CHLORIDE 0.9 % IV BOLUS (SEPSIS)
1000.0000 mL | Freq: Once | INTRAVENOUS | Status: AC
  Administered 2016-06-26: 1000 mL via INTRAVENOUS

## 2016-06-26 MED ORDER — KETOROLAC TROMETHAMINE 30 MG/ML IJ SOLN
15.0000 mg | Freq: Once | INTRAMUSCULAR | Status: AC
  Administered 2016-06-26: 15 mg via INTRAVENOUS

## 2016-06-26 MED ORDER — ONDANSETRON HCL 4 MG PO TABS: 4.0000 mg | ORAL_TABLET | Freq: Three times a day (TID) | ORAL | 0 refills | Status: DC | PRN

## 2016-06-26 MED ORDER — OXYCODONE-ACETAMINOPHEN 5-325 MG PO TABS: 1.0000 | ORAL_TABLET | Freq: Four times a day (QID) | ORAL | 0 refills | Status: DC | PRN

## 2016-06-26 MED ORDER — ONDANSETRON HCL 4 MG/2ML IJ SOLN
4.0000 mg | Freq: Once | INTRAMUSCULAR | Status: AC
  Administered 2016-06-26: 4 mg via INTRAVENOUS
  Filled 2016-06-26: qty 2

## 2016-06-26 MED ORDER — KETOROLAC TROMETHAMINE 30 MG/ML IJ SOLN
INTRAMUSCULAR | Status: AC
  Administered 2016-06-26: 15 mg via INTRAVENOUS
  Filled 2016-06-26: qty 1

## 2016-06-26 MED ORDER — TAMSULOSIN HCL 0.4 MG PO CAPS: 0.4000 mg | ORAL_CAPSULE | Freq: Every day | ORAL | 0 refills | Status: DC

## 2016-06-26 MED ORDER — MORPHINE SULFATE (PF) 4 MG/ML IV SOLN
4.0000 mg | Freq: Once | INTRAVENOUS | Status: AC
  Administered 2016-06-26: 4 mg via INTRAVENOUS
  Filled 2016-06-26: qty 1

## 2016-06-26 NOTE — ED Triage Notes (Signed)
L flank pain x 2 days, history of kidney stones.

## 2016-06-26 NOTE — ED Provider Notes (Addendum)
[facility] Emergency Department Provider Note  ____________________________________________   I have reviewed the triage vital signs and the nursing notes.   HISTORY  Chief Complaint Flank Pain    HPI [name] is a 51 y.o. male who states he's had at least 45 kidney stones, presents today with left flank pain which is sudden onset over the weekend. Denies hematuria or vomiting. Denies fever or dysuria. Does have nausea. Pain is exactly like prior kidney stones. Waxes and wanes.    Past Medical History:  Diagnosis Date  . Hematuria   . History of kidney stones   . Left ureteral calculus   . Nephrolithiasis     There are no active problems to display for this patient.   Past Surgical History:  Procedure Laterality Date  . CYSTO/  URETEROSCOPY LASER LITHOTRIPSY WITH STONE EXTRACTION/  STENT PLACEMENT Right 2014   (raliegh)  . CYSTOSCOPY WITH URETEROSCOPY AND STENT PLACEMENT Left 12/23/2013   Procedure: LEFT URETEROSCOPY WITH STENT EXCHANGE, STONE EXTRACTION WITH BASKET;  Surgeon: Anner CreteJohn J Wrenn, MD;  Location[facility];  Service: Urology;  Laterality: Left;  . CYSTOSCOPY/RETROGRADE/URETEROSCOPY Left 12/08/2013   Procedure: CYSTOSCOPY/LEFT RETROGRADE PYELOGRAM/ INSERTION LEFT URETERAL STENT;  Surgeon: Anner CreteJohn J Wrenn, MD;  Location: [facility];  Service: Urology;  Laterality: Left;    Prior to Admission medications   Medication Sig Start Date End Date Taking? Authorizing Provider  ciprofloxacin (CIPRO) 500 MG tablet Take 1 tablet (500 mg total) by mouth 2 (two) times daily. 12/08/13   Bjorn PippinJohn Wrenn, MD  Multiple Vitamin (MULTIVITAMIN WITH MINERALS) TABS tablet Take 1 tablet by mouth daily.    Historical Provider, MD  naproxen (NAPROSYN) 500 MG tablet Take 1 tablet (500 mg total) by mouth 2 (two) times daily with a meal. 01/24/16   Tommi Rumpshonda L Summers, PA-C  naproxen sodium (ANAPROX) 220 MG tablet Take 440 mg by mouth 2 (two) times daily as needed  (pain).    Historical Provider, MD  ondansetron (ZOFRAN) 4 MG tablet Take 1 tablet (4 mg total) by mouth daily as needed for nausea or vomiting. 01/10/16   Jene Everyobert Kinner, MD  oxyCODONE-acetaminophen (ROXICET) 5-325 MG tablet Take 1 tablet by mouth every 6 (six) hours as needed. 01/10/16 01/09/17  Jene Everyobert Kinner, MD  phenazopyridine (PYRIDIUM) 200 MG tablet Take 1 tablet (200 mg total) by mouth 3 (three) times daily as needed for pain. 12/08/13   Bjorn PippinJohn Wrenn, MD  promethazine (PHENERGAN) 25 MG tablet Take 1 tablet (25 mg total) by mouth every 6 (six) hours as needed for nausea or vomiting. 12/08/13   Bjorn PippinJohn Wrenn, MD  tamsulosin (FLOMAX) 0.4 MG CAPS capsule Take 1 capsule (0.4 mg total) by mouth daily. 01/10/16   Jene Everyobert Kinner, MD    Allergies Patient has no known allergies.  No family history on file.  Social History Social History  Substance Use Topics  . Smoking status: Never Smoker  . Smokeless tobacco: Never Used  . Alcohol use No    Review of Systems Constitutional: No fever/chills Eyes: No visual changes. ENT: No sore throat. No stiff neck no neck pain Cardiovascular: Denies chest pain. Respiratory: Denies shortness of breath. Gastrointestinal:   no vomiting.  No diarrhea.  No constipation. Genitourinary: Negative for dysuria. Musculoskeletal: Negative lower extremity swelling Skin: Negative for rash. Neurological: Negative for severe headaches, focal weakness or numbness. 10-point ROS otherwise negative.  ____________________________________________   PHYSICAL EXAM:  VITAL SIGNS: ED Triage Vitals [06/26/16 0951]  Enc Vitals  Group     BP (!) 140/95     Pulse Rate 98     Resp 18     Temp 98.7 F (37.1 C)     Temp Source Oral     SpO2 97 %     Weight 240 lb (108.9 kg)     Height 5\' 8"  (1.727 m)     Head Circumference      Peak Flow      Pain Score      Pain Loc      Pain Edu?      Excl. in GC?     Constitutional: Alert and oriented. Well appearing and in no acute  distress. Cardiovascular: Normal rate, regular rhythm. Grossly normal heart sounds.  Good peripheral circulation. Respiratory: Normal respiratory effort.  No retractions. Lungs CTAB. Abdominal: Soft and nontender. No distention. No guarding no rebound Back:  There is no focal tenderness or step off.  there is no midline tenderness there are no lesions noted. there is left CVA tenderness Musculoskeletal: No lower extremity tenderness, no upper extremity tenderness. No joint effusions, no DVT signs strong distal pulses no edema Neurologic:  Normal speech and language. No gross focal neurologic deficits are appreciated.  Skin:  Skin is warm, dry and intact. No rash noted. Psychiatric: Mood and affect are normal. Speech and behavior are normal.  ____________________________________________   LABS (all labs ordered are listed, but only abnormal results are displayed)  Labs Reviewed  URINALYSIS, COMPLETE (UACMP) WITH MICROSCOPIC  BASIC METABOLIC PANEL  CBC   ____________________________________________  EKG  I personally interpreted any EKGs ordered by me or triage  ____________________________________________  RADIOLOGY  I reviewed any imaging ordered by me or triage that were performed during my shift and, if possible, patient and/or family made aware of any abnormal findings. ____________________________________________   PROCEDURES  Procedure(s) performed: None  Procedures  Critical Care performed: None  ____________________________________________   INITIAL IMPRESSION / ASSESSMENT AND PLAN / ED COURSE  Pertinent labs & imaging results that were available during my care of the patient were reviewed by me and considered in my medical decision making (see chart for details).  Patient with a history of kidney stones and multiple different CT scans in the past presents today with sudden onset left flank pain over the weekend which persists. I am reluctant to do a CT scan  on this patient as he has had Minimal different CT scans in the past The radiation burden on this patient is going to be significant. Low suspicion for AAA or other intra-abdominal pathology that would require emergent imaging. We will check kidney function, urinalysis, basic blood work and give him pain control and reassess.  ----------------------------------------- 12:19 PM on 06/26/2016 -----------------------------------------  Urine is somewhat equivocal for infection however there is also hematuria and I suspect this is a kidney stone. We have again discussed whether we should obtain a CT scan. Patient does not wish to have a CT scan at this time. He understands the risk of radiation with frequent stones. He is not febrile he has no elevated white count does not appear to be systemically ill, nonetheless given there is some leukocytes we will start him on Keflex pending culture. I think however this is much more likely to be a stone given his chronic history. Nothing to suggest AAA, no evidence of lumbar strain or neurologic deficit nothing to suggest cauda equina syndrome, abdomen is completely benign. Patient is pain is improve with Toradol but he  is requesting narcotic pain medication prior to discharge. He does have a car here. I have stated that I would not give him any narcotics unless he can get a ride home he states that his mother will come pick him up. He understands he must not drive after narcotic pain medication. Patient however appears very comfortable and does not show any evidence of ongoing distress.    ____________________________________________   FINAL CLINICAL IMPRESSION(S) / ED DIAGNOSES  Final diagnoses:  None      This chart was dictated using voice recognition software.  Despite best efforts to proofread,  errors can occur which can change meaning.      Jeanmarie Plant, MD 06/26/16 1055    Jeanmarie Plant, MD 06/26/16 [phone]

## 2016-06-27 LAB — URINE CULTURE: Culture: <10000 — AB

## 2016-06-28 ENCOUNTER — Telehealth: Payer: Self-pay | Admitting: Emergency Medicine

## 2016-06-28 NOTE — Telephone Encounter (Signed)
Pt calling to get a refill of pain medication , was told at discharge that he could call back if he ran out of medication and we could re-fill it. Pt was informed that this  is untrue and if he was still in pain that he would have to get a re-evaluation for his pain. Pt was questioned if he made his follow -up appointment with urology and he has not. Pt was encouraged to follow through with the follow up. Pt verbalized understanding concerning the re-fill of medication and scheduling his follow-up.

## 2016-08-23 ENCOUNTER — Encounter: Payer: Self-pay | Admitting: *Deleted

## 2016-08-23 ENCOUNTER — Emergency Department
Admission: EM | Admit: 2016-08-23 | Discharge: 2016-08-23 | Disposition: A | Discharge status: Home / Self Care | Payer: Self-pay | Attending: Emergency Medicine | Admitting: Emergency Medicine

## 2016-08-23 DIAGNOSIS — Z7689 Persons encountering health services in other specified circumstances: Secondary | ICD-10-CM

## 2016-08-23 DIAGNOSIS — N342 Other urethritis: Secondary | ICD-10-CM | POA: Insufficient documentation

## 2016-08-23 DIAGNOSIS — Z202 Contact with and (suspected) exposure to infections with a predominantly sexual mode of transmission: Secondary | ICD-10-CM | POA: Insufficient documentation

## 2016-08-23 DIAGNOSIS — Z79899 Other long term (current) drug therapy: Secondary | ICD-10-CM | POA: Insufficient documentation

## 2016-08-23 LAB — URINALYSIS, ROUTINE W REFLEX MICROSCOPIC
BACTERIA UA: NONE SEEN
Bilirubin Urine: NEGATIVE
Glucose, UA: NEGATIVE mg/dL
KETONES UR: NEGATIVE mg/dL
NITRITE: NEGATIVE
PROTEIN: NEGATIVE mg/dL
SPECIFIC GRAVITY, URINE: 1.016 (ref 1.005–1.030)
pH: 5 (ref 5.0–8.0)

## 2016-08-23 LAB — CHLAMYDIA/NGC RT PCR (ARMC ONLY)
CHLAMYDIA TR: NOT DETECTED
N gonorrhoeae: NOT DETECTED

## 2016-08-23 MED ORDER — LIDOCAINE HCL (PF) 1 % IJ SOLN
INTRAMUSCULAR | Status: AC
  Filled 2016-08-23: qty 5

## 2016-08-23 MED ORDER — AZITHROMYCIN 500 MG PO TABS
1000.0000 mg | ORAL_TABLET | Freq: Once | ORAL | Status: AC
  Administered 2016-08-23: 1000 mg via ORAL
  Filled 2016-08-23: qty 2

## 2016-08-23 MED ORDER — CEFTRIAXONE SODIUM 250 MG IJ SOLR
250.0000 mg | Freq: Once | INTRAMUSCULAR | Status: AC
  Administered 2016-08-23: 250 mg via INTRAMUSCULAR
  Filled 2016-08-23: qty 250

## 2016-08-23 NOTE — ED Triage Notes (Signed)
States a foul smelling milky green penile discharge for 4 days, states foul smelling urine, states unprotected sex but not a new partner, awake and alert

## 2016-08-23 NOTE — Discharge Instructions (Signed)
You have been tested and treated for two common STDs (gonorrhea & chlamydia). You tests results are pending at the time of discharge. You should avoid any sexual contact until your partner(s) have been treated, and all are symptom-free. Follow-up with the [facility] for routine testing of other STDs. Feel free to call back to confirm test results.

## 2016-08-23 NOTE — ED Provider Notes (Signed)
[facility] Emergency Department Provider Note ____________________________________________  Time seen: 1021  I have reviewed the triage vital signs and the nursing notes.  HISTORY  Chief Complaint  Penile Discharge   HPI [name] is a 51 y.o. male presents to the ED for evaluation of a malodorous, milky, green, penile discharge for the last 4 days. He denies dysuria, hematuria, urinary retention, pelvic, or flank pain. He denies any new sexual encounters, but does endorse unporotected sex with his regular sex partner.He denies any fevers, chills, sweats, or nausea. He also denies any lesions, ulcers, or blisters.   Past Medical History:  Diagnosis Date  . Hematuria   . History of kidney stones   . Left ureteral calculus   . Nephrolithiasis     There are no active problems to display for this patient.   Past Surgical History:  Procedure Laterality Date  . CYSTO/  URETEROSCOPY LASER LITHOTRIPSY WITH STONE EXTRACTION/  STENT PLACEMENT Right 2014   (raliegh)  . CYSTOSCOPY WITH URETEROSCOPY AND STENT PLACEMENT Left 12/23/2013   Procedure: LEFT URETEROSCOPY WITH STENT EXCHANGE, STONE EXTRACTION WITH BASKET;  Surgeon: Anner Crete, MD;  Location: [facility];  Service: Urology;  Laterality: Left;  . CYSTOSCOPY/RETROGRADE/URETEROSCOPY Left 12/08/2013   Procedure: CYSTOSCOPY/LEFT RETROGRADE PYELOGRAM/ INSERTION LEFT URETERAL STENT;  Surgeon: Anner Crete, MD;  Location: [facility];  Service: Urology;  Laterality: Left;    Prior to Admission medications   Medication Sig Start Date End Date Taking? Authorizing Provider  ciprofloxacin (CIPRO) 500 MG tablet Take 1 tablet (500 mg total) by mouth 2 (two) times daily. 12/08/13   Bjorn Pippin, MD  Multiple Vitamin (MULTIVITAMIN WITH MINERALS) TABS tablet Take 1 tablet by mouth daily.    Historical Provider, MD  naproxen (NAPROSYN) 500 MG tablet Take 1 tablet (500 mg total) by mouth 2 (two) times daily with a  meal. 01/24/16   Tommi Rumps, PA-C  naproxen sodium (ANAPROX) 220 MG tablet Take 440 mg by mouth 2 (two) times daily as needed (pain).    Historical Provider, MD  ondansetron (ZOFRAN) 4 MG tablet Take 1 tablet (4 mg total) by mouth every 8 (eight) hours as needed for nausea or vomiting. 06/26/16   Jeanmarie Plant, MD  oxyCODONE-acetaminophen (ROXICET) 5-325 MG tablet Take 1 tablet by mouth every 6 (six) hours as needed. 06/26/16   Jeanmarie Plant, MD  phenazopyridine (PYRIDIUM) 200 MG tablet Take 1 tablet (200 mg total) by mouth 3 (three) times daily as needed for pain. 12/08/13   Bjorn Pippin, MD  promethazine (PHENERGAN) 25 MG tablet Take 1 tablet (25 mg total) by mouth every 6 (six) hours as needed for nausea or vomiting. 12/08/13   Bjorn Pippin, MD  tamsulosin (FLOMAX) 0.4 MG CAPS capsule Take 1 capsule (0.4 mg total) by mouth daily. 06/26/16   Jeanmarie Plant, MD    Allergies Patient has no known allergies.  History reviewed. No pertinent family history.  Social History Social History  Substance Use Topics  . Smoking status: Never Smoker  . Smokeless tobacco: Never Used  . Alcohol use No    Review of Systems  Constitutional: Negative for fever. Eyes: Negative for visual changes. ENT: Negative for sore throat. Cardiovascular: Negative for chest pain. Respiratory: Negative for shortness of breath. Gastrointestinal: Negative for abdominal pain, vomiting and diarrhea. Genitourinary: Negative for dysuria. Penile discharge as above.  Musculoskeletal: Negative for back pain. Skin: Negative for rash. Neurological: Negative for headaches, focal  weakness or numbness. ____________________________________________  PHYSICAL EXAM:  VITAL SIGNS: ED Triage Vitals [08/23/16 1014]  Enc Vitals Group     BP (!) 139/95     Pulse Rate 96     Resp 18     Temp 98 F (36.7 C)     Temp Source Oral     SpO2 98 %     Weight 250 lb (113.4 kg)     Height  (1.727 m)     Head Circumference       Peak Flow      Pain Score 0     Pain Loc      Pain Edu?      Excl. in GC?     Constitutional: Alert and oriented. Well appearing and in no distress. Head: Normocephalic and atraumatic. Eyes: Conjunctivae are normal. PERRL. Normal extraocular movements Cardiovascular: Normal rate, regular rhythm. Normal distal pulses. Respiratory: Normal respiratory effort. No wheezes/rales/rhonchi. GU: Deferred Musculoskeletal: Nontender with normal range of motion in all extremities.  Neurologic:  Normal gait without ataxia. Normal speech and language. No gross focal neurologic deficits are appreciated. Skin:  Skin is warm, dry and intact. No rash noted. ____________________________________________   LABS (pertinent positives/negatives)  Labs Reviewed  URINALYSIS, ROUTINE W REFLEX MICROSCOPIC - Abnormal; Notable for the following:       Result Value   Color, Urine YELLOW (*)    APPearance CLEAR (*)    Hgb urine dipstick SMALL (*)    Leukocytes, UA SMALL (*)    Squamous Epithelial / LPF 0-5 (*)    All other components within normal limits  CHLAMYDIA/NGC RT PCR ([facility] ONLY)  ____________________________________________  PROCEDURES  Rocephin 250 mg IM Azithromycin 1 g PO ____________________________________________  INITIAL IMPRESSION / ASSESSMENT AND PLAN / ED COURSE  Patient with complaint of purulent penile discharge, treated empirically for gonorrhea and chlamydia. He opted to leave after treatment and prior to test results. He is further referred to the [facility] for further treatment. He is advised that his partner(s) should be treated.   ----------------------------------------- 14:41 PM on 08/23/2016 ----------------------------------------- Called patient at number listed on chart to relay culture results. Voicemail message with only my name and number left. ____________________________________________  FINAL CLINICAL IMPRESSION(S) / ED DIAGNOSES  Final diagnoses:   Urethritis  Encounter for assessment of STD exposure      Lissa Hoard, PA-C 08/23/16 2243    Emily Filbert, MD 08/24/16 [phone]

## 2016-08-23 NOTE — ED Notes (Signed)
See triage note. States he developed some burning with urination and foul smell with some discharge for the past 3 days

## 2016-09-20 ENCOUNTER — Emergency Department
Admission: EM | Admit: 2016-09-20 | Discharge: 2016-09-20 | Disposition: A | Discharge status: Home / Self Care | Payer: Self-pay | Attending: Emergency Medicine | Admitting: Emergency Medicine

## 2016-09-20 ENCOUNTER — Telehealth: Payer: Self-pay

## 2016-09-20 ENCOUNTER — Emergency Department: Payer: Self-pay

## 2016-09-20 DIAGNOSIS — R202 Paresthesia of skin: Secondary | ICD-10-CM | POA: Insufficient documentation

## 2016-09-20 DIAGNOSIS — R079 Chest pain, unspecified: Secondary | ICD-10-CM | POA: Insufficient documentation

## 2016-09-20 LAB — BASIC METABOLIC PANEL
ANION GAP: 6 (ref 5–15)
BUN: 15 mg/dL (ref 6–20)
CHLORIDE: 106 mmol/L (ref 101–111)
CO2: 25 mmol/L (ref 22–32)
Calcium: 9.5 mg/dL (ref 8.9–10.3)
Creatinine, Ser: 0.99 mg/dL (ref 0.61–1.24)
GFR calc Af Amer: >60 mL/min (ref >60)
GLUCOSE: 133 mg/dL — AB (ref 65–99)
POTASSIUM: 4.1 mmol/L (ref 3.5–5.1)
Sodium: 137 mmol/L (ref 135–145)

## 2016-09-20 LAB — CBC
HEMATOCRIT: 46.4 % (ref 40.0–52.0)
HEMOGLOBIN: 16.2 g/dL (ref 13.0–18.0)
MCH: 32.3 pg (ref 26.0–34.0)
MCHC: 35 g/dL (ref 32.0–36.0)
MCV: 92.3 fL (ref 80.0–100.0)
Platelets: 238 10*3/uL (ref 150–440)
RBC: 5.02 MIL/uL (ref 4.40–5.90)
RDW: 13 % (ref 11.5–14.5)
WBC: 11.3 10*3/uL — ABNORMAL HIGH (ref 3.8–10.6)

## 2016-09-20 LAB — TROPONIN I
Troponin I: <0.03 ng/mL (ref <0.03)
Troponin I: <0.03 ng/mL (ref <0.03)

## 2016-09-20 IMAGING — CR DG CHEST 2V
2 series · 2 of 2 positions shown · non-contrast
Comparison: None.

CLINICAL DATA: Intermittent chest pain for 1 month

EXAM:
CHEST  2 VIEW

[chest pa]
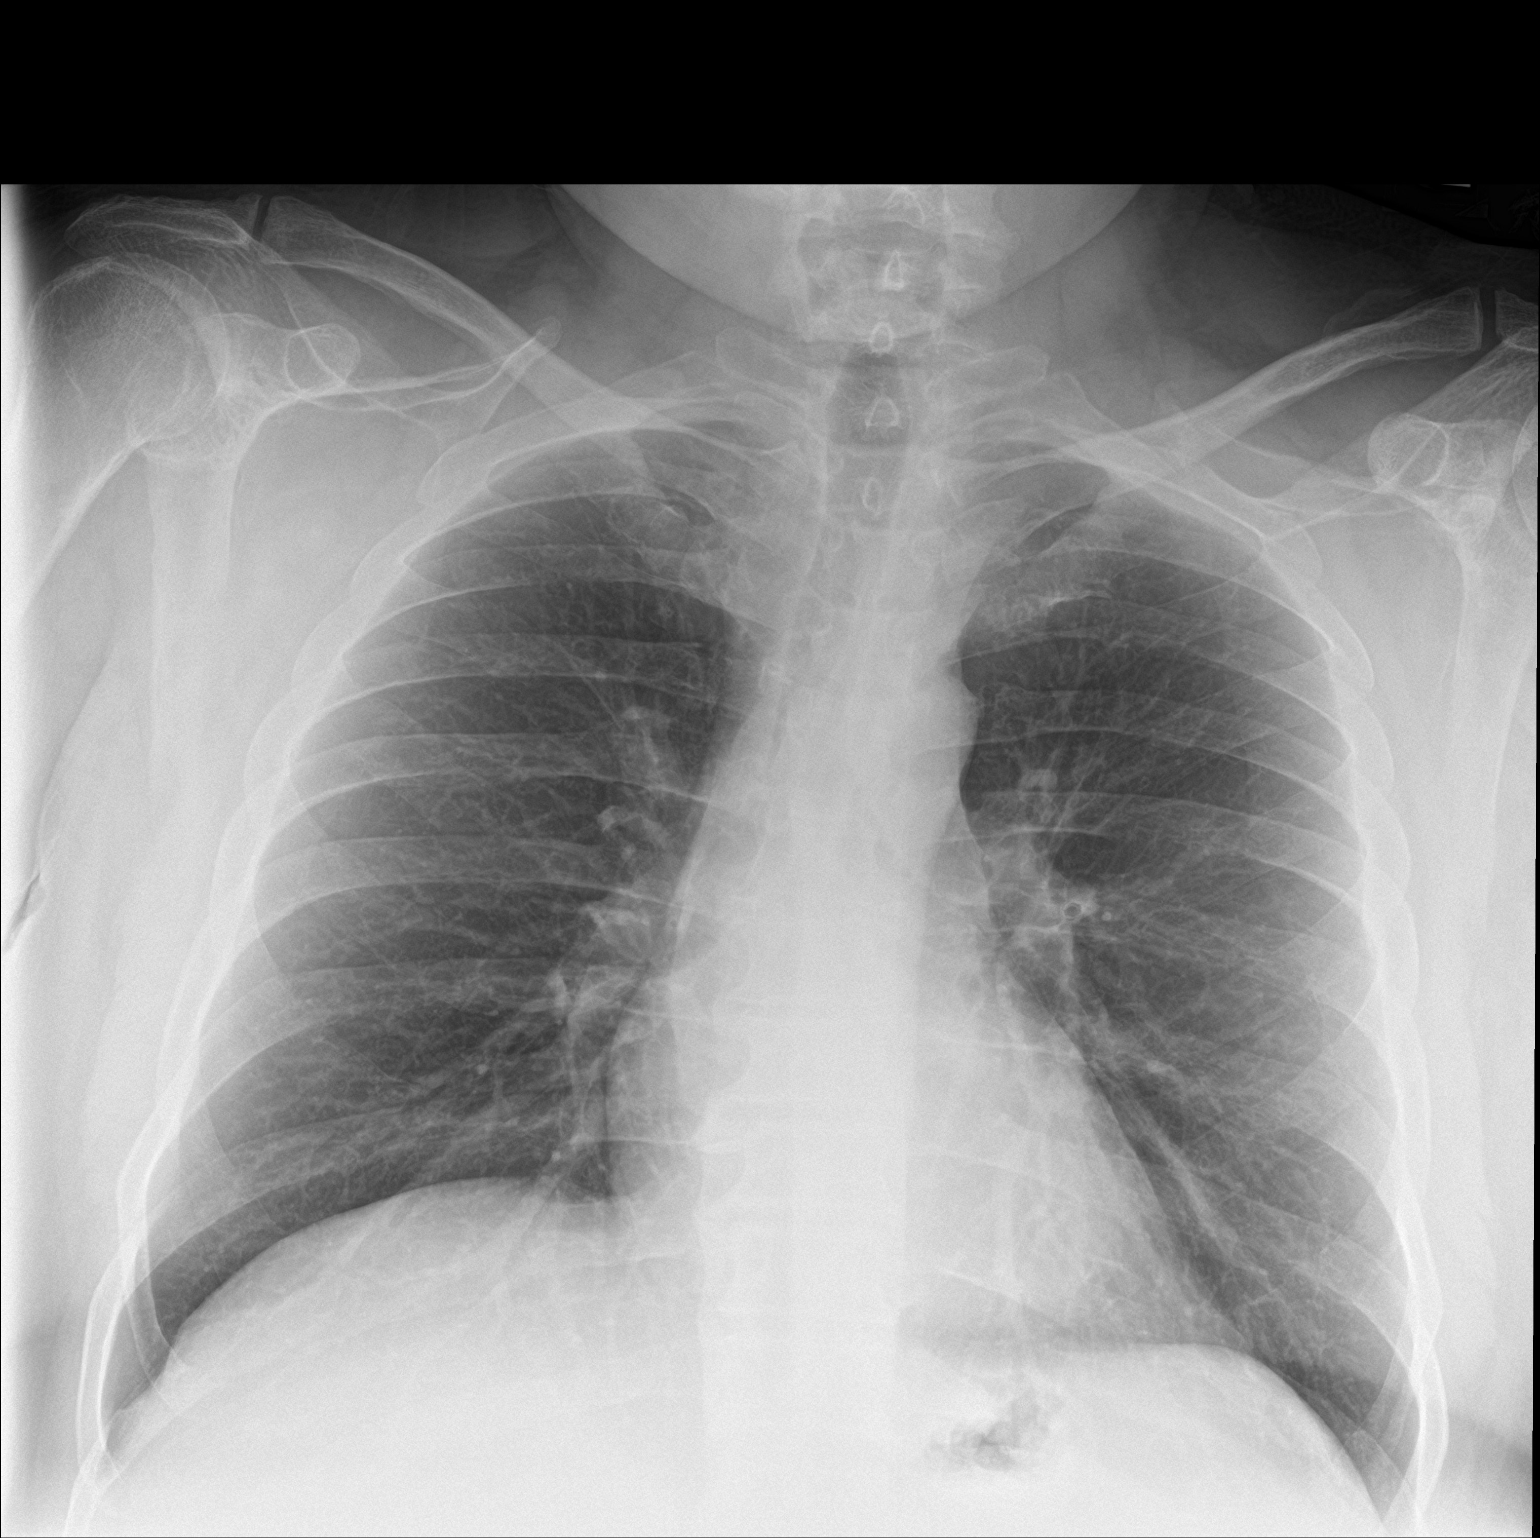

[chest lat]
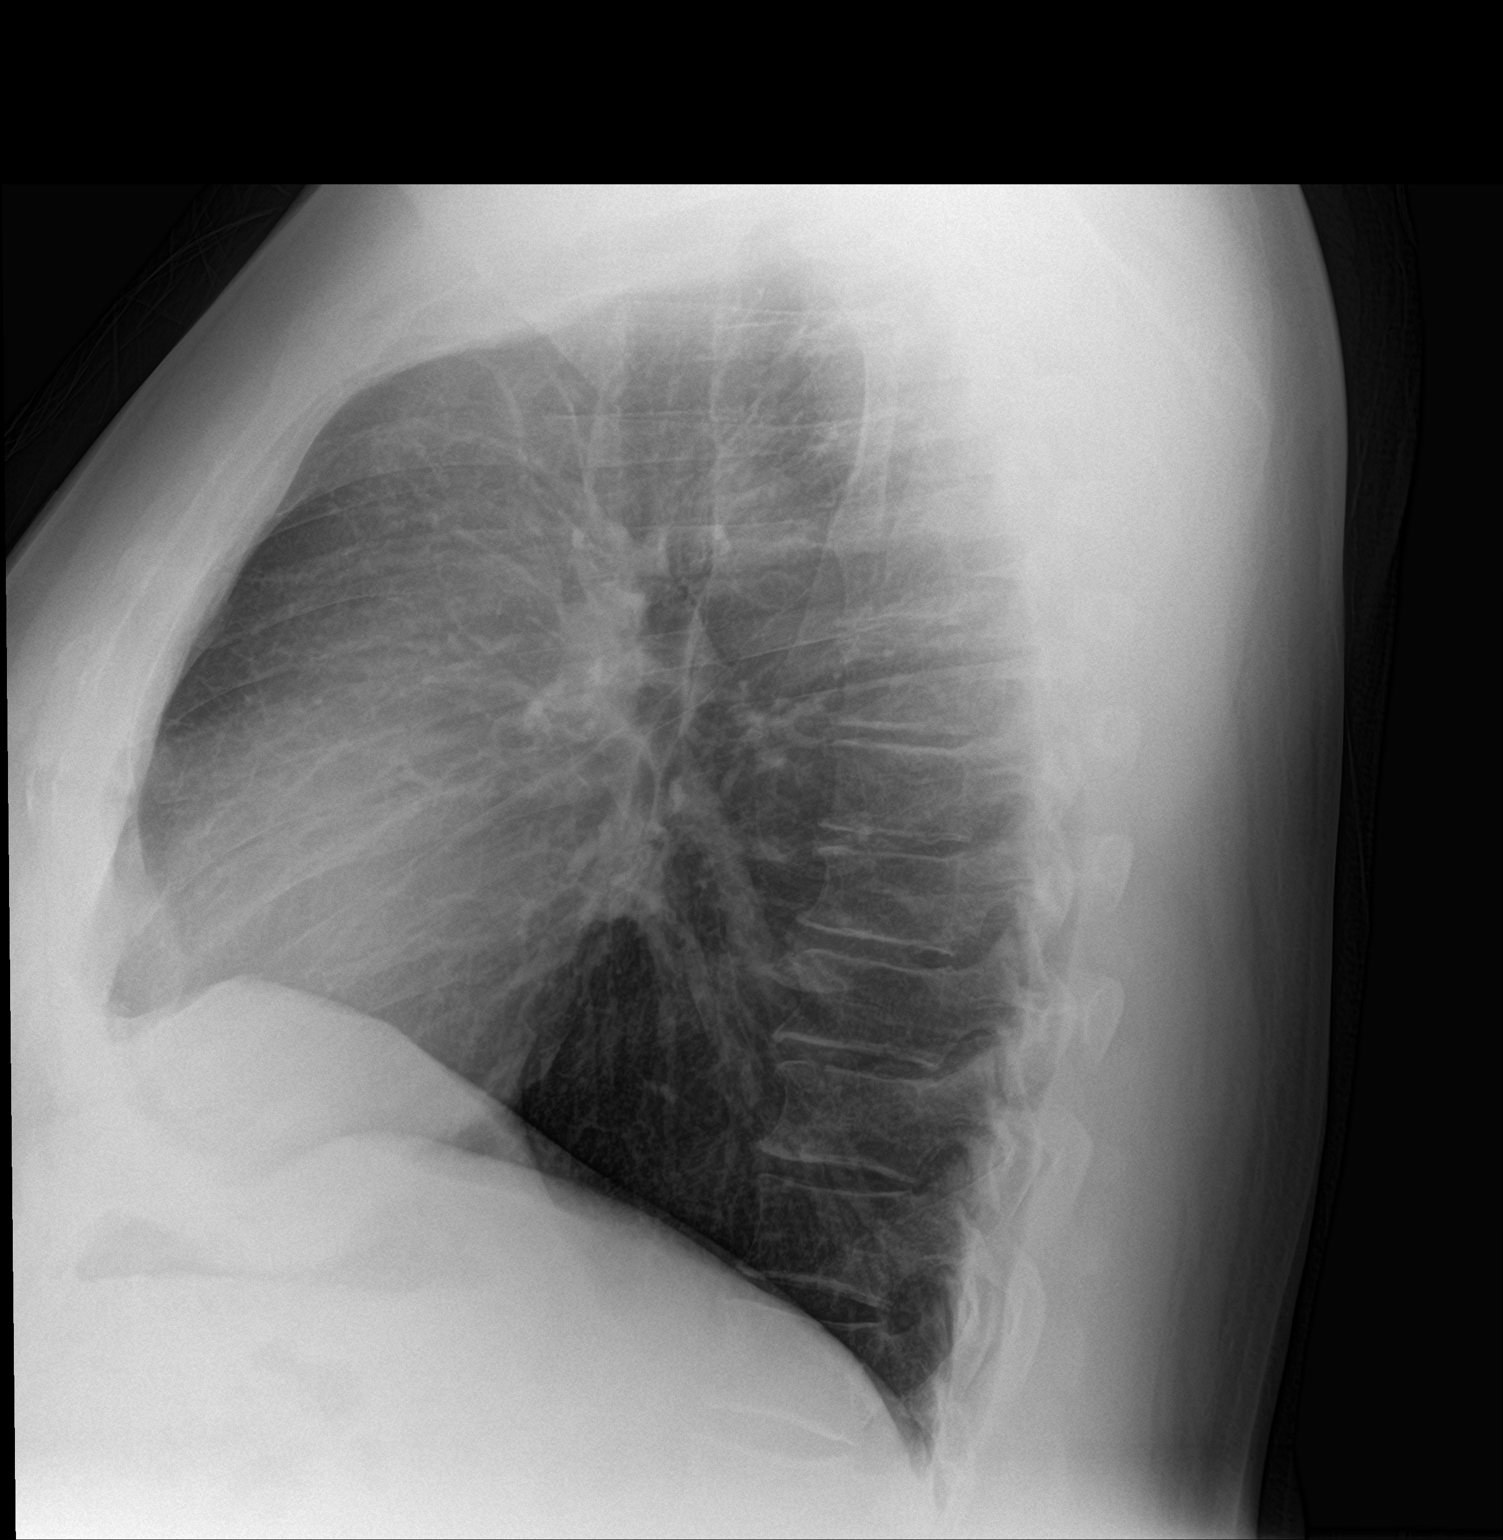

[2 of 2 positions shown; findings below may reference images not displayed]

FINDINGS: The heart size and mediastinal contours are within normal limits.
Both lungs are clear. The visualized skeletal structures are
unremarkable.
IMPRESSION: No active cardiopulmonary disease.

## 2016-09-20 MED ORDER — ASPIRIN 81 MG PO CHEW
324.0000 mg | CHEWABLE_TABLET | Freq: Once | ORAL | Status: AC
  Administered 2016-09-20: 324 mg via ORAL
  Filled 2016-09-20: qty 4

## 2016-09-20 MED ORDER — PREDNISONE 10 MG (21) PO TBPK: ORAL_TABLET | Freq: Every day | ORAL | 0 refills | Status: DC

## 2016-09-20 MED ORDER — ASPIRIN 81 MG PO TBEC: 81.0000 mg | DELAYED_RELEASE_TABLET | Freq: Every day | ORAL | 12 refills | Status: AC

## 2016-09-20 MED ORDER — PREDNISONE 20 MG PO TABS
60.0000 mg | ORAL_TABLET | Freq: Once | ORAL | Status: AC
  Administered 2016-09-20: 60 mg via ORAL
  Filled 2016-09-20: qty 3

## 2016-09-20 NOTE — Telephone Encounter (Signed)
Lmov for patient to call back he was in ED on 09/20/16 for CP Will try again later time

## 2016-09-20 NOTE — ED Provider Notes (Signed)
[facility] Emergency Department Provider Note       Time seen: ----------------------------------------- 11:01 AM on 09/20/2016 -----------------------------------------     I have reviewed the triage vital signs and the nursing notes.   HISTORY   Chief Complaint Chest Pain; Numbness; and Fatigue    HPI [name] is a 51 y.o. male who presents to the ED for intermittent chest pain for about a month with left arm paresthesias. Patient reports been having pain that comes and goes and he had tingling in his left arm for about an hour prior to arrival. Patient reports feeling more fatigued than normal. He had a friend check his blood pressure yesterday and reportedly was around 170 systolic and he was encouraged comes to ER for evaluation. Patient states he has night sweats but no shortness of breath, no nausea vomiting or other symptoms. He does smoke but has no cardiac history or family history.   Past Medical History:  Diagnosis Date  . Hematuria   . History of kidney stones   . Left ureteral calculus   . Nephrolithiasis     There are no active problems to display for this patient.   Past Surgical History:  Procedure Laterality Date  . CYSTO/  URETEROSCOPY LASER LITHOTRIPSY WITH STONE EXTRACTION/  STENT PLACEMENT Right 2014   (raliegh)  . CYSTOSCOPY WITH URETEROSCOPY AND STENT PLACEMENT Left 12/23/2013   Procedure: LEFT URETEROSCOPY WITH STENT EXCHANGE, STONE EXTRACTION WITH BASKET;  Surgeon: Anner Crete, MD;  Location: [facility];  Service: Urology;  Laterality: Left;  . CYSTOSCOPY/RETROGRADE/URETEROSCOPY Left 12/08/2013   Procedure: CYSTOSCOPY/LEFT RETROGRADE PYELOGRAM/ INSERTION LEFT URETERAL STENT;  Surgeon: Anner Crete, MD;  Location: [facility];  Service: Urology;  Laterality: Left;    Allergies Patient has no known allergies.  Social History Social History  Substance Use Topics  . Smoking status: Never Smoker  .  Smokeless tobacco: Never Used  . Alcohol use No    Review of Systems Constitutional: Negative for fever. Eyes: Negative for vision changes ENT:  Negative for congestion, sore throat Cardiovascular: Positive for chest pain Respiratory: Negative for shortness of breath. Gastrointestinal: Negative for abdominal pain, vomiting and diarrhea. Genitourinary: Negative for dysuria. Musculoskeletal: Negative for back pain. Skin: Negative for rash. Neurological: Positive for paresthesias in left arm  All systems negative/normal/unremarkable except as stated in the HPI  ____________________________________________   PHYSICAL EXAM:  VITAL SIGNS: ED Triage Vitals [09/20/16 1054]  Enc Vitals Group     BP 116/76     Pulse Rate 74     Resp 20     Temp 98.8 F (37.1 C)     Temp Source Oral     SpO2 97 %     Weight 250 lb (113.4 kg)     Height 5\' 8"  (1.727 m)     Head Circumference      Peak Flow      Pain Score 5     Pain Loc      Pain Edu?      Excl. in GC?     Constitutional: Alert and oriented. Well appearing and in no distress. Eyes: Conjunctivae are normal. Normal extraocular movements. ENT   Head: Normocephalic and atraumatic.   Nose: No congestion/rhinnorhea.   Mouth/Throat: Mucous membranes are moist.   Neck: No stridor. Cardiovascular: Normal rate, regular rhythm. No murmurs, rubs, or gallops. Respiratory: Normal respiratory effort without tachypnea nor retractions. Breath sounds are clear and equal bilaterally. No wheezes/rales/rhonchi.  Gastrointestinal: Soft and nontender. Normal bowel sounds Musculoskeletal: Nontender with normal range of motion in extremities. No lower extremity tenderness nor edema. Neurologic:  Normal speech and language. No gross focal neurologic deficits are appreciated. Left arm paresthesias are noted Skin:  Skin is warm, dry and intact. No rash noted. Psychiatric: Mood and affect are normal. Speech and behavior are normal.   ____________________________________________  EKG: Interpreted by me. Sinus rhythm rate of 70 bpm, normal PR interval, normal QRS, normal QT.  ____________________________________________  ED COURSE:  Pertinent labs & imaging results that were available during my care of the patient were reviewed by me and considered in my medical decision making (see chart for details). Patient presents for chest pain and paresthesias, we will assess with labs and imaging as indicated.   Procedures ____________________________________________   LABS (pertinent positives/negatives)  Labs Reviewed  BASIC METABOLIC PANEL - Abnormal; Notable for the following:       Result Value   Glucose, Bld 133 (*)    All other components within normal limits  CBC - Abnormal; Notable for the following:    WBC 11.3 (*)    All other components within normal limits  TROPONIN I  TROPONIN I    RADIOLOGY Images were viewed by me  Chest x-ray IMPRESSION: No active cardiopulmonary disease. ____________________________________________  FINAL ASSESSMENT AND PLAN  Chest pain, paresthesias  Plan: Patient's labs and imaging were dictated above. Patient had presented for Chest pain of uncertain etiology. He also has some paresthesias in the left arm which I feel are likely from cervical radiculopathy. We'll place him on prednisone as well as baby aspirin daily. Repeat troponin was negative, he is stable for outpatient cardiology follow-up.   Emily FilbertWilliams, Lulla Linville E, MD   Note: This note was generated in part or whole with voice recognition software. Voice recognition is usually quite accurate but there are transcription errors that can and very often do occur. I apologize for any typographical errors that were not detected and corrected.     Emily FilbertWilliams, Henli Hey E, MD 09/20/16 1328

## 2016-09-20 NOTE — ED Triage Notes (Signed)
Pt reports that he has been having intermittent chest pain for about 1 month, this am pt began having pain with numbness into rt arm 1 hr pta. Pt reports he has been feeling more fatigued than usual.

## 2016-09-22 NOTE — Telephone Encounter (Signed)
Lmov for patient to call back he was in ED on 09/20/16 for CP Will try again later time

## 2016-09-27 NOTE — Telephone Encounter (Signed)
Lmov for patient to call back he was in ED on 09/20/16 for CP °Will try again later time  °

## 2016-09-28 NOTE — Telephone Encounter (Signed)
Sent letter

## 2017-02-07 ENCOUNTER — Encounter: Payer: Self-pay | Admitting: Emergency Medicine

## 2017-02-07 ENCOUNTER — Emergency Department: Payer: Self-pay

## 2017-02-07 ENCOUNTER — Emergency Department
Admission: EM | Admit: 2017-02-07 | Discharge: 2017-02-07 | Disposition: A | Discharge status: Home / Self Care | Payer: Self-pay | Attending: Student in an Organized Health Care Education/Training Program | Admitting: Student in an Organized Health Care Education/Training Program

## 2017-02-07 DIAGNOSIS — N341 Nonspecific urethritis: Secondary | ICD-10-CM | POA: Insufficient documentation

## 2017-02-07 DIAGNOSIS — R109 Unspecified abdominal pain: Secondary | ICD-10-CM

## 2017-02-07 DIAGNOSIS — F17210 Nicotine dependence, cigarettes, uncomplicated: Secondary | ICD-10-CM | POA: Insufficient documentation

## 2017-02-07 DIAGNOSIS — N342 Other urethritis: Secondary | ICD-10-CM

## 2017-02-07 DIAGNOSIS — Z7982 Long term (current) use of aspirin: Secondary | ICD-10-CM | POA: Insufficient documentation

## 2017-02-07 LAB — CBC
HCT: 45 % (ref 40.0–52.0)
HEMOGLOBIN: 15.7 g/dL (ref 13.0–18.0)
MCH: 31.8 pg (ref 26.0–34.0)
MCHC: 34.9 g/dL (ref 32.0–36.0)
MCV: 91.1 fL (ref 80.0–100.0)
Platelets: 268 10*3/uL (ref 150–440)
RBC: 4.94 MIL/uL (ref 4.40–5.90)
RDW: 13.1 % (ref 11.5–14.5)
WBC: 9.3 10*3/uL (ref 3.8–10.6)

## 2017-02-07 LAB — BASIC METABOLIC PANEL
Anion gap: 10 (ref 5–15)
BUN: 17 mg/dL (ref 6–20)
CHLORIDE: 104 mmol/L (ref 101–111)
CO2: 24 mmol/L (ref 22–32)
Calcium: 8.8 mg/dL — ABNORMAL LOW (ref 8.9–10.3)
Creatinine, Ser: 1.05 mg/dL (ref 0.61–1.24)
GFR calc Af Amer: >60 mL/min (ref >60)
GFR calc non Af Amer: >60 mL/min (ref >60)
Glucose, Bld: 145 mg/dL — ABNORMAL HIGH (ref 65–99)
POTASSIUM: 4 mmol/L (ref 3.5–5.1)
SODIUM: 138 mmol/L (ref 135–145)

## 2017-02-07 LAB — URINALYSIS, COMPLETE (UACMP) WITH MICROSCOPIC
Bilirubin Urine: NEGATIVE
Glucose, UA: NEGATIVE mg/dL
KETONES UR: NEGATIVE mg/dL
Nitrite: NEGATIVE
PH: 5 (ref 5.0–8.0)
Protein, ur: 30 mg/dL — AB
SPECIFIC GRAVITY, URINE: 1.025 (ref 1.005–1.030)

## 2017-02-07 IMAGING — CT CT RENAL STONE PROTOCOL
2 of 4 series · 16 of 46 positions shown, 18 images · non-contrast
Comparison: 01/10/2016, 12/08/2013

CLINICAL DATA: Left flank pain and dysuria.  Onset tonight.

EXAM:
CT ABDOMEN AND PELVIS WITHOUT CONTRAST
TECHNIQUE: Multidetector CT imaging of the abdomen and pelvis was performed
following the standard protocol without IV contrast.

[Series 2: stone full standard · axial · 0.82mm/px · z∈[-1056,-616]mm · 13 of 98 slices shown, 15 images]
[im 5/98  soft-tissue]
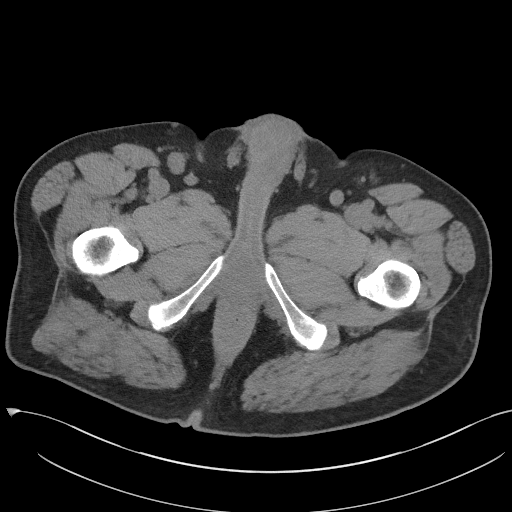
[im 5/98  bone]
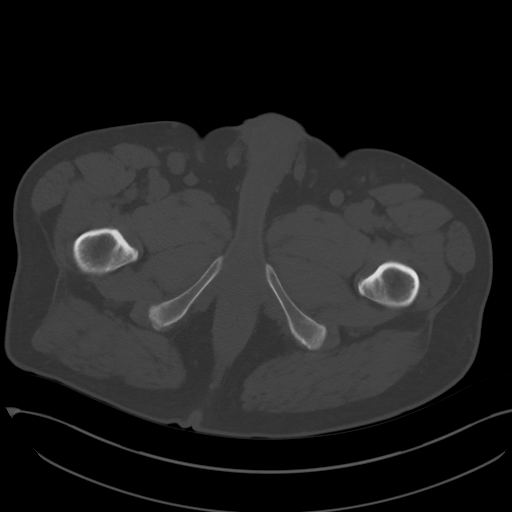
[im 13/98  soft-tissue]
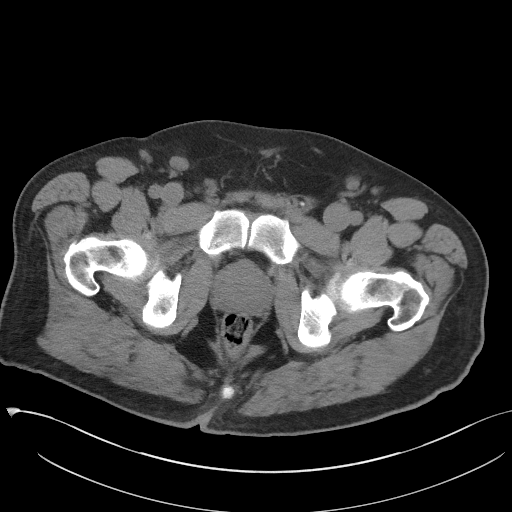
[im 22/98  soft-tissue]
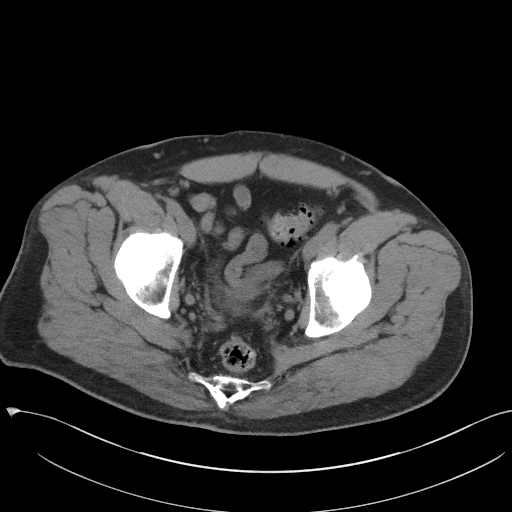
[im 26/98  soft-tissue]
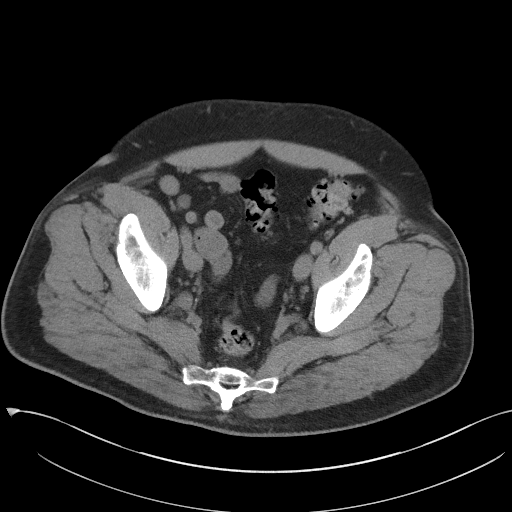
[im 34/98  soft-tissue]
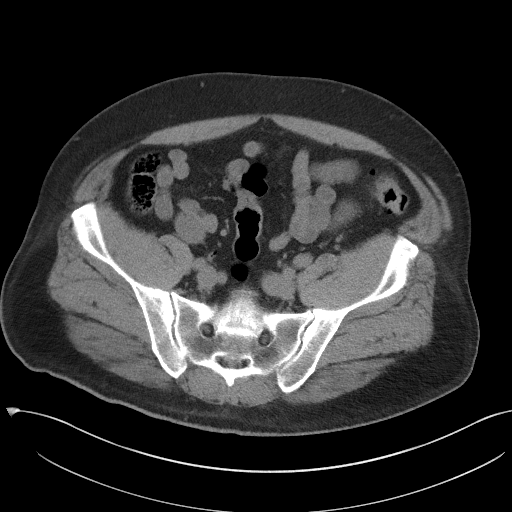
[im 43/98  soft-tissue]
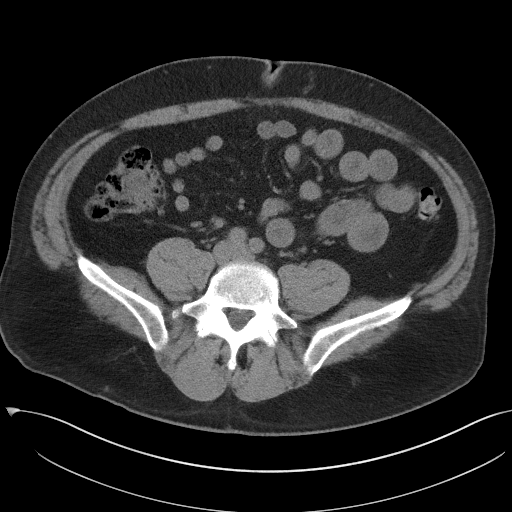
[im 51/98  soft-tissue]
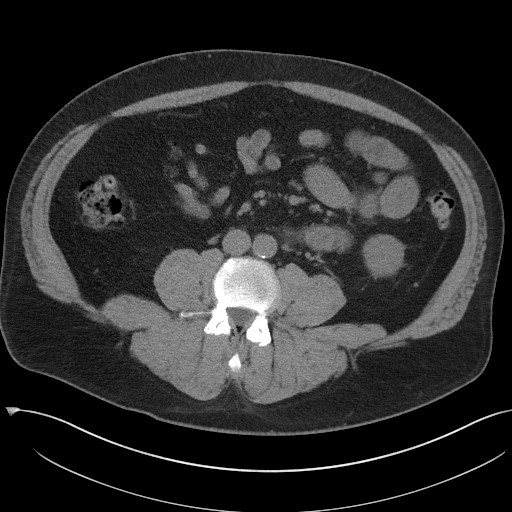
[im 55/98  soft-tissue]
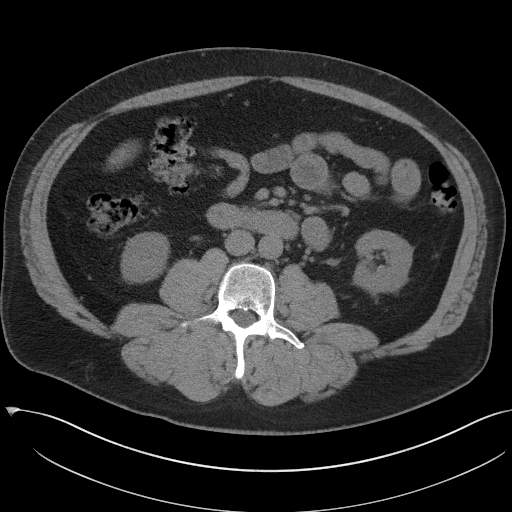
[im 64/98  soft-tissue]
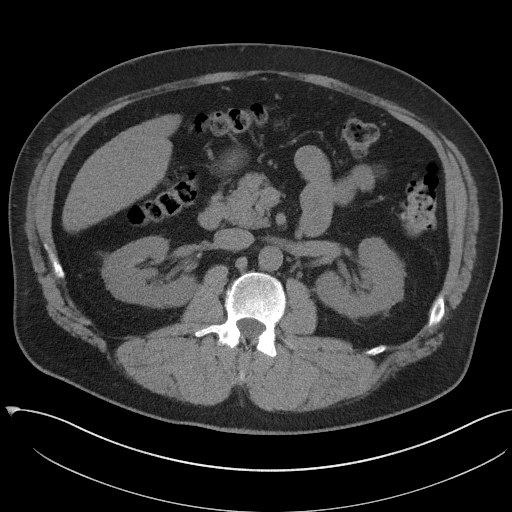
[im 64/98  bone]
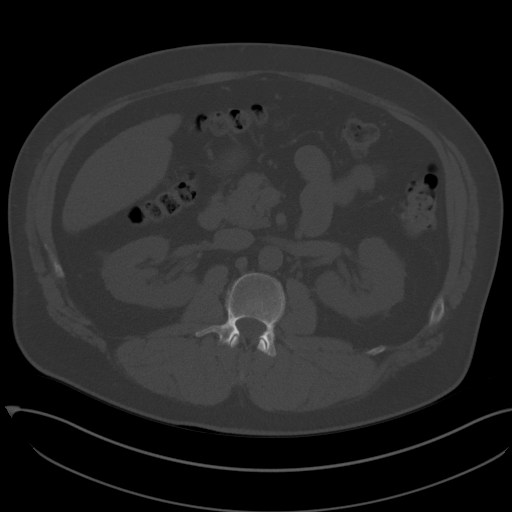
[im 72/98  soft-tissue]
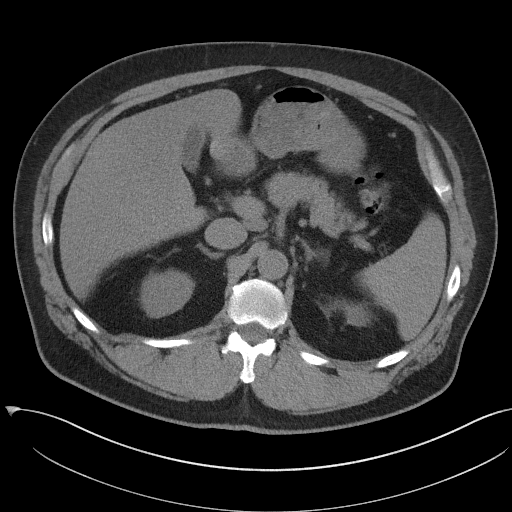
[im 76/98  soft-tissue]
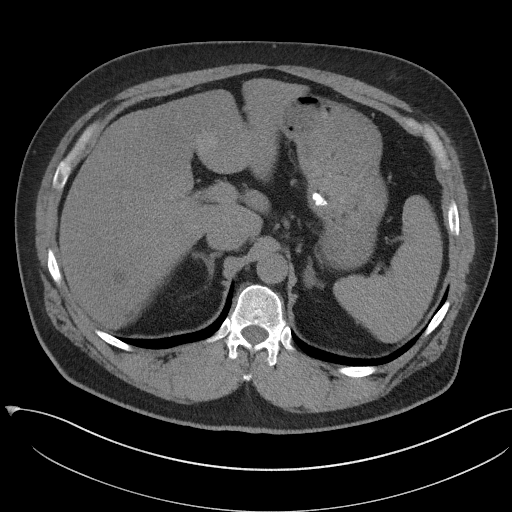
[im 85/98  soft-tissue]
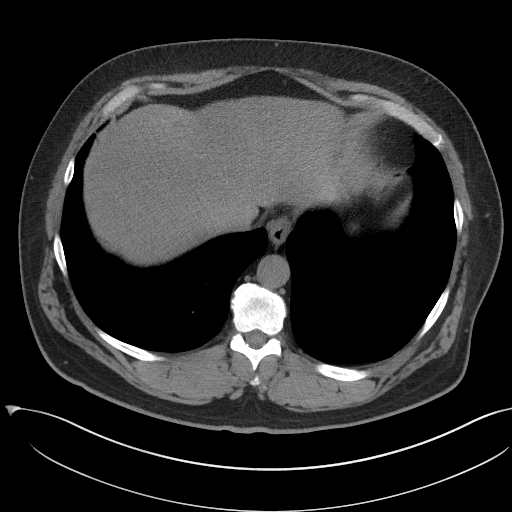
[im 93/98  soft-tissue]
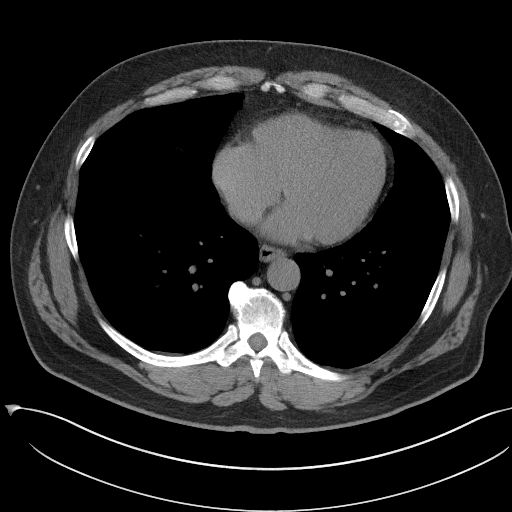

[Series 5: coronal · coronal · 0.81mm/px · 3 of 160 slices shown]
[im 54/160  soft-tissue]
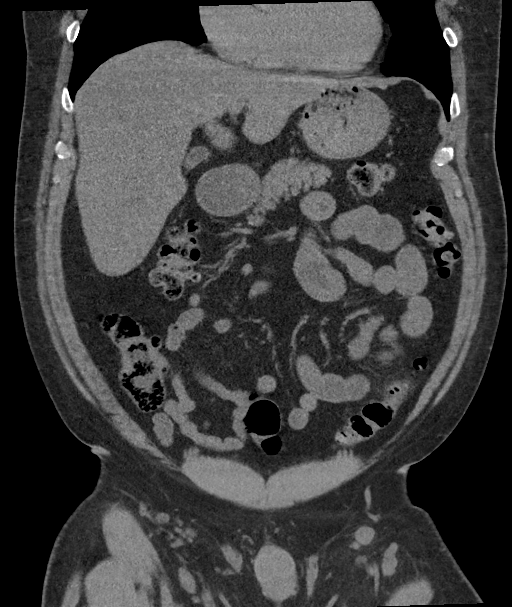
[im 71/160  soft-tissue]
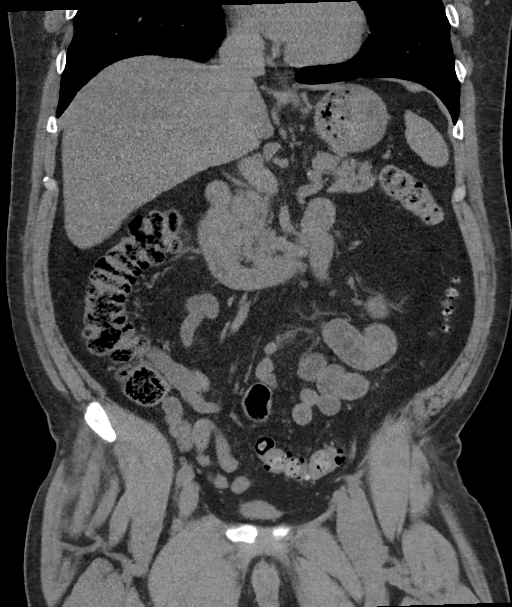
[im 89/160  soft-tissue]
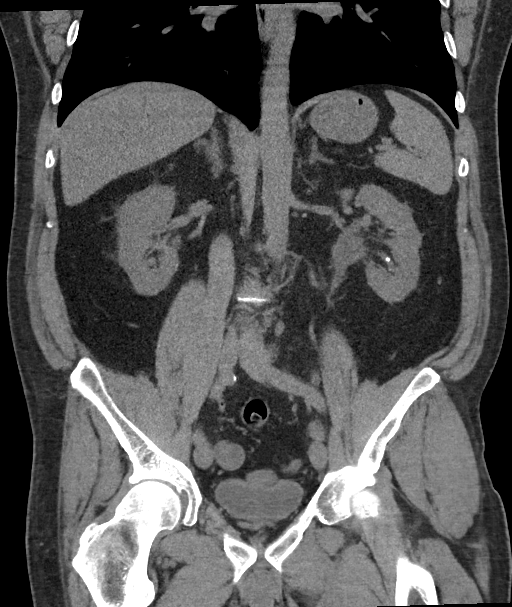

[16 of 46 positions shown; findings below may reference images not displayed]

FINDINGS: Lower chest: No acute abnormality.

Hepatobiliary: There are a few low-attenuation lesions of the liver,
measuring up to 1.4 cm. These are benign based on long-term
stability, unchanged from 12/08/2013. No significant focal liver
lesions. Gallbladder and bile ducts are unremarkable.

Pancreas: Unremarkable. No pancreatic ductal dilatation or
surrounding inflammatory changes.

Spleen: Normal in size without focal abnormality.

Adrenals/Urinary Tract: 1.5 cm indeterminate left adrenal nodule,
slowly enlarging over the past several exams.

Normal right adrenal.

No significant renal parenchymal lesions on unenhanced scanning.

2 x 5 mm lower pole left collecting system calculus. No ureteral
calculi. No hydronephrosis or ureteral dilatation. Unremarkable
urinary bladder.

Stomach/Bowel: Stomach and small bowel are normal. Appendix is
normal. Moderate uncomplicated colonic diverticulosis. No
obstruction or inflammation of bowel.

Vascular/Lymphatic: The abdominal aorta is normal in caliber with
mild atherosclerotic calcification. No clearly pathologic adenopathy
in the abdomen or pelvis. Mildly prominent bilateral inguinal nodes,
indeterminate but more likely reactive.

Reproductive: Unremarkable

Other: No focal inflammation. No ascites. Moderate fat containing
umbilical hernia.

Musculoskeletal: No significant skeletal lesion.
IMPRESSION: 1. Left nephrolithiasis.  No ureterolithiasis.
2. Indeterminate 1.5 cm left adrenal nodule. Consider dedicated
adrenal CT or MRI on a nonemergent basis.
3. Benign low-attenuation liver lesions, with documented long-term
stability.
4. Colonic diverticulosis.
5. Fat containing umbilical hernia.
6. Aortic atherosclerosis.

## 2017-02-07 MED ORDER — SODIUM CHLORIDE 0.9 % IV BOLUS (SEPSIS)
1000.0000 mL | Freq: Once | INTRAVENOUS | Status: AC
  Administered 2017-02-07: 1000 mL via INTRAVENOUS

## 2017-02-07 MED ORDER — OXYCODONE-ACETAMINOPHEN 5-325 MG PO TABS
ORAL_TABLET | ORAL | Status: AC
  Filled 2017-02-07: qty 1

## 2017-02-07 MED ORDER — TAMSULOSIN HCL 0.4 MG PO CAPS: 0.4000 mg | ORAL_CAPSULE | Freq: Every day | ORAL | 1 refills | Status: DC

## 2017-02-07 MED ORDER — PHENAZOPYRIDINE HCL 100 MG PO TABS: 100.0000 mg | ORAL_TABLET | Freq: Three times a day (TID) | ORAL | 0 refills | Status: DC | PRN

## 2017-02-07 MED ORDER — AZITHROMYCIN 500 MG PO TABS
1000.0000 mg | ORAL_TABLET | Freq: Once | ORAL | Status: AC
  Administered 2017-02-07: 1000 mg via ORAL
  Filled 2017-02-07: qty 2

## 2017-02-07 MED ORDER — ACYCLOVIR 200 MG PO CAPS: 200.0000 mg | ORAL_CAPSULE | Freq: Every day | ORAL | 0 refills | Status: AC

## 2017-02-07 MED ORDER — CEFTRIAXONE SODIUM 250 MG IJ SOLR
250.0000 mg | Freq: Once | INTRAMUSCULAR | Status: AC
  Administered 2017-02-07: 250 mg via INTRAMUSCULAR
  Filled 2017-02-07: qty 250

## 2017-02-07 MED ORDER — MORPHINE SULFATE (PF) 4 MG/ML IV SOLN
4.0000 mg | INTRAVENOUS | Status: DC | PRN
  Administered 2017-02-07: 4 mg via INTRAVENOUS
  Filled 2017-02-07: qty 1

## 2017-02-07 MED ORDER — KETOROLAC TROMETHAMINE 30 MG/ML IJ SOLN
15.0000 mg | Freq: Once | INTRAMUSCULAR | Status: AC
  Administered 2017-02-07: 15 mg via INTRAVENOUS

## 2017-02-07 MED ORDER — HYDROCODONE-ACETAMINOPHEN 5-325 MG PO TABS: 1.0000 | ORAL_TABLET | ORAL | 0 refills | Status: DC | PRN

## 2017-02-07 MED ORDER — ONDANSETRON HCL 4 MG/2ML IJ SOLN
4.0000 mg | Freq: Once | INTRAMUSCULAR | Status: AC
  Administered 2017-02-07: 4 mg via INTRAVENOUS
  Filled 2017-02-07: qty 2

## 2017-02-07 MED ORDER — FENTANYL CITRATE (PF) 100 MCG/2ML IJ SOLN
50.0000 ug | INTRAMUSCULAR | Status: DC | PRN
  Administered 2017-02-07: 50 ug via INTRAVENOUS
  Filled 2017-02-07: qty 2

## 2017-02-07 MED ORDER — OXYCODONE-ACETAMINOPHEN 5-325 MG PO TABS
1.0000 | ORAL_TABLET | Freq: Once | ORAL | Status: AC
  Administered 2017-02-07: 1 via ORAL

## 2017-02-07 MED ORDER — KETOROLAC TROMETHAMINE 30 MG/ML IJ SOLN
INTRAMUSCULAR | Status: AC
  Filled 2017-02-07: qty 1

## 2017-02-07 NOTE — ED Provider Notes (Addendum)
[facility] Emergency Department Provider Note    First MD Initiated Contact with Patient 02/07/17 1858     (approximate)  I have reviewed the triage vital signs and the nursing notes.   HISTORY  Chief Complaint Flank Pain    HPI [name] is a 51 y.o. male presents from home with chief complaint of severe, crampy and aching left flank pain radiating down to his groin. States the pain started last night and was waxing and waning throughout the day. Does have a history of kidney stones. States he gets an attack roughly every 6 months. Has not followed up with urologist. Denies any fever. Denies any nausea or vomiting.pulse is complaining of penile pain but denies any significant discharge. Was recently tested for [facility] and chlamydia that was negative.  Is sexually active.   Past Medical History:  Diagnosis Date  . Hematuria   . History of kidney stones   . Left ureteral calculus   . Nephrolithiasis    History reviewed. No pertinent family history. Past Surgical History:  Procedure Laterality Date  . CYSTO/  URETEROSCOPY LASER LITHOTRIPSY WITH STONE EXTRACTION/  STENT PLACEMENT Right 2014   (raliegh)  . CYSTOSCOPY WITH URETEROSCOPY AND STENT PLACEMENT Left 12/23/2013   Procedure: LEFT URETEROSCOPY WITH STENT EXCHANGE, STONE EXTRACTION WITH BASKET;  Surgeon: Anner Crete, MD;  Location: [facility];  Service: Urology;  Laterality: Left;  . CYSTOSCOPY/RETROGRADE/URETEROSCOPY Left 12/08/2013   Procedure: CYSTOSCOPY/LEFT RETROGRADE PYELOGRAM/ INSERTION LEFT URETERAL STENT;  Surgeon: Anner Crete, MD;  Location: [facility];  Service: Urology;  Laterality: Left;   There are no active problems to display for this patient.     Prior to Admission medications   Medication Sig Start Date End Date Taking? Authorizing Provider  acyclovir (ZOVIRAX) 200 MG capsule Take 1 capsule (200 mg total) by mouth 5 (five) times daily. 02/07/17 02/17/17  Willy Eddy, MD  aspirin 81 MG EC tablet Take 1 tablet (81 mg total) by mouth daily. Swallow whole. 09/20/16   Emily Filbert, MD  ciprofloxacin (CIPRO) 500 MG tablet Take 1 tablet (500 mg total) by mouth 2 (two) times daily. Patient not taking: Reported on 09/20/2016 12/08/13   Bjorn Pippin, MD  HYDROcodone-acetaminophen [facility]) 5-325 MG tablet Take 1 tablet by mouth every 4 (four) hours as needed for moderate pain. DO NOT fill without accompanied acyclovir 02/07/17   Willy Eddy, MD  Multiple Vitamin (MULTIVITAMIN WITH MINERALS) TABS tablet Take 1 tablet by mouth daily.    [provider]  naproxen (NAPROSYN) 500 MG tablet Take 1 tablet (500 mg total) by mouth 2 (two) times daily with a meal. Patient not taking: Reported on 09/20/2016 01/24/16   Tommi Rumps, PA-C  naproxen sodium (ANAPROX) 220 MG tablet Take 440 mg by mouth 2 (two) times daily as needed (pain).    [provider]  ondansetron (ZOFRAN) 4 MG tablet Take 1 tablet (4 mg total) by mouth every 8 (eight) hours as needed for nausea or vomiting. 06/26/16   Jeanmarie Plant, MD  oxyCODONE-acetaminophen (ROXICET) 5-325 MG tablet Take 1 tablet by mouth every 6 (six) hours as needed. Patient not taking: Reported on 09/20/2016 06/26/16   Jeanmarie Plant, MD  phenazopyridine (PYRIDIUM) 100 MG tablet Take 1 tablet (100 mg total) by mouth 3 (three) times daily as needed for pain. 02/07/17 02/07/18  Willy Eddy, MD  phenazopyridine (PYRIDIUM) 200 MG tablet Take 1 tablet (200 mg total) by  mouth 3 (three) times daily as needed for pain. Patient not taking: Reported on 09/20/2016 12/08/13   Bjorn Pippin, MD  predniSONE (STERAPRED UNI-PAK 21 TAB) 10 MG (21) TBPK tablet Take by mouth daily. Dispense steroid taper pack as instructed 09/20/16   Emily Filbert, MD  promethazine (PHENERGAN) 25 MG tablet Take 1 tablet (25 mg total) by mouth every 6 (six) hours as needed for nausea or vomiting. Patient not taking: Reported on  09/20/2016 12/08/13   Bjorn Pippin, MD  tamsulosin (FLOMAX) 0.4 MG CAPS capsule Take 1 capsule (0.4 mg total) by mouth daily. Patient not taking: Reported on 09/20/2016 06/26/16   Jeanmarie Plant, MD  tamsulosin (FLOMAX) 0.4 MG CAPS capsule Take 1 capsule (0.4 mg total) by mouth daily after supper. 02/07/17   Willy Eddy, MD    Allergies Patient has no known allergies.    Social History Social History  Substance Use Topics  . Smoking status: Current Every Day Smoker    Packs/day: 0.50    Types: Cigarettes  . Smokeless tobacco: Never Used  . Alcohol use No    Review of Systems Patient denies headaches, rhinorrhea, blurry vision, numbness, shortness of breath, chest pain, edema, cough, abdominal pain, nausea, vomiting, diarrhea, dysuria, fevers, rashes or hallucinations unless otherwise stated above in HPI. ____________________________________________   PHYSICAL EXAM:  VITAL SIGNS: Vitals:   02/07/17 1646  BP: (!) 107/91  Pulse: (!) 102  Resp: 18  Temp: 98.9 F (37.2 C)  SpO2: 98%    Constitutional: Alert and oriented. Well appearing and in no acute distress. Eyes: Conjunctivae are normal.  Head: Atraumatic. Nose: No congestion/rhinnorhea. Mouth/Throat: Mucous membranes are moist.   Neck: No stridor. Painless ROM.  Cardiovascular: Normal rate, regular rhythm. Grossly normal heart sounds.  Good peripheral circulation. Respiratory: Normal respiratory effort.  No retractions. Lungs CTAB. Gastrointestinal: Soft and nontender. No distention. No abdominal bruits. No CVA tenderness. Genitourinary: there is irritation at the distal urethra meatus and seropurulent discharge. No vesicles.. Musculoskeletal: No lower extremity tenderness nor edema.  No joint effusions. Neurologic:  Normal speech and language. No gross focal neurologic deficits are appreciated. No facial droop Skin:  Skin is warm, dry and intact. No rash noted. Psychiatric: Mood and affect are normal. Speech  and behavior are normal.  ____________________________________________   LABS (all labs ordered are listed, but only abnormal results are displayed)  Results for orders placed or performed during the hospital encounter of 02/07/17 (from the past 24 hour(s))  Urinalysis, Complete w Microscopic     Status: Abnormal   Collection Time: 02/07/17  4:47 PM  Result Value Ref Range   Color, Urine YELLOW (A) YELLOW   APPearance HAZY (A) CLEAR   Specific Gravity, Urine 1.025 1.005 - 1.030   pH 5.0 5.0 - 8.0   Glucose, UA NEGATIVE NEGATIVE mg/dL   Hgb urine dipstick LARGE (A) NEGATIVE   Bilirubin Urine NEGATIVE NEGATIVE   Ketones, ur NEGATIVE NEGATIVE mg/dL   Protein, ur 30 (A) NEGATIVE mg/dL   Nitrite NEGATIVE NEGATIVE   Leukocytes, UA SMALL (A) NEGATIVE   RBC / HPF TOO NUMEROUS TO COUNT 0 - 5 RBC/hpf   WBC, UA TOO NUMEROUS TO COUNT 0 - 5 WBC/hpf   Bacteria, UA RARE (A) NONE SEEN   Squamous Epithelial / LPF 0-5 (A) NONE SEEN   Mucus PRESENT    Ca Oxalate Crys, UA PRESENT   CBC     Status: None   Collection Time: 02/07/17  4:47 PM  Result Value Ref Range   WBC 9.3 3.8 - 10.6 K/uL   RBC 4.94 4.40 - 5.90 MIL/uL   Hemoglobin 15.7 13.0 - 18.0 g/dL   HCT 96.0 45.4 - 09.8 %   MCV 91.1 80.0 - 100.0 fL   MCH 31.8 26.0 - 34.0 pg   MCHC 34.9 32.0 - 36.0 g/dL   RDW 11.9 14.7 - 82.9 %   Platelets 268 150 - 440 K/uL  Basic metabolic panel     Status: Abnormal   Collection Time: 02/07/17  4:47 PM  Result Value Ref Range   Sodium 138 135 - 145 mmol/L   Potassium 4.0 3.5 - 5.1 mmol/L   Chloride 104 101 - 111 mmol/L   CO2 24 22 - 32 mmol/L   Glucose, Bld 145 (H) 65 - 99 mg/dL   BUN 17 6 - 20 mg/dL   Creatinine, Ser 5.62 0.61 - 1.24 mg/dL   Calcium 8.8 (L) 8.9 - 10.3 mg/dL   GFR calc non Af Amer >60 >60 mL/min   GFR calc Af Amer >60 >60 mL/min   Anion gap 10 5 - 15   ____________________________________________ ____________________________________________  RADIOLOGY  I personally  reviewed all radiographic images ordered to evaluate for the above acute complaints and reviewed radiology reports and findings.  These findings were personally discussed with the patient.  Please see medical record for radiology report.  ____________________________________________   PROCEDURES  Procedure(s) performed:  Procedures    Critical Care performed: no ____________________________________________   INITIAL IMPRESSION / ASSESSMENT AND PLAN / ED COURSE  Pertinent labs & imaging results that were available during my care of the patient were reviewed by me and considered in my medical decision making (see chart for details).  DDX: stone, pyelo, colick, colitis, hernia, msk strain  [name] is a 51 y.o. who presents to the chief complaint of left flank pain as well is dysuria and pain radiating to his penis. Patient with history of stones and states it feels the same. CT imaging shows no evidence of acute stone. Urinalysis shows no evidence of infection but there are leukocytes. Patient is sexually active but was tested for urethritis and had negative GC and CZ. Denies any history of herpes I do not observe any evidence of vesicles but herpes urethritis remains on the differential. We'll start patient. On acyclovir and have patient follow-up with health Department.      ____________________________________________   FINAL CLINICAL IMPRESSION(S) / ED DIAGNOSES  Final diagnoses:  Left flank pain  Urethritis      NEW MEDICATIONS STARTED DURING THIS VISIT:  New Prescriptions   ACYCLOVIR (ZOVIRAX) 200 MG CAPSULE    Take 1 capsule (200 mg total) by mouth 5 (five) times daily.   HYDROCODONE-ACETAMINOPHEN (NORCO) 5-325 MG TABLET    Take 1 tablet by mouth every 4 (four) hours as needed for moderate pain. DO NOT fill without accompanied acyclovir   PHENAZOPYRIDINE (PYRIDIUM) 100 MG TABLET    Take 1 tablet (100 mg total) by mouth 3 (three) times daily as needed for pain.     TAMSULOSIN (FLOMAX) 0.4 MG CAPS CAPSULE    Take 1 capsule (0.4 mg total) by mouth daily after supper.     Note:  This document was prepared using Dragon voice recognition software and may include unintentional dictation errors.    Willy Eddy, MD 02/07/17 2038    Willy Eddy, MD 02/07/17 2039

## 2017-02-07 NOTE — ED Triage Notes (Signed)
Pt arrived via POV from home with reports of left flank pain. Pt has hx of the same about 6 months ago.  Pt is able to urinate. Pt states it is difficult. Pain is 10/10. Pt states the pain started yesterday evening and has remained unchanged.  Pt took ibuprofen last night. Pt has not seen Urologist as he does not have insurance.

## 2017-02-07 NOTE — ED Notes (Signed)
Reviewed d/c instructions, follow-up care, prescriptions with patient. Pt verbalized understanding.  

## 2017-02-07 NOTE — ED Notes (Signed)
ED Provider at bedside. 

## 2017-03-02 ENCOUNTER — Emergency Department: Payer: Self-pay

## 2017-03-02 ENCOUNTER — Emergency Department
Admission: EM | Admit: 2017-03-02 | Discharge: 2017-03-02 | Disposition: A | Discharge status: Home / Self Care | Payer: Self-pay | Attending: Emergency Medicine | Admitting: Emergency Medicine

## 2017-03-02 ENCOUNTER — Encounter: Payer: Self-pay | Admitting: Emergency Medicine

## 2017-03-02 DIAGNOSIS — F17210 Nicotine dependence, cigarettes, uncomplicated: Secondary | ICD-10-CM | POA: Insufficient documentation

## 2017-03-02 DIAGNOSIS — Z79899 Other long term (current) drug therapy: Secondary | ICD-10-CM | POA: Insufficient documentation

## 2017-03-02 DIAGNOSIS — N342 Other urethritis: Secondary | ICD-10-CM

## 2017-03-02 DIAGNOSIS — N200 Calculus of kidney: Secondary | ICD-10-CM | POA: Insufficient documentation

## 2017-03-02 DIAGNOSIS — N341 Nonspecific urethritis: Secondary | ICD-10-CM | POA: Insufficient documentation

## 2017-03-02 LAB — CBC
HCT: 47.8 % (ref 40.0–52.0)
HEMOGLOBIN: 16.3 g/dL (ref 13.0–18.0)
MCH: 31.2 pg (ref 26.0–34.0)
MCHC: 34.2 g/dL (ref 32.0–36.0)
MCV: 91.3 fL (ref 80.0–100.0)
Platelets: 262 10*3/uL (ref 150–440)
RBC: 5.23 MIL/uL (ref 4.40–5.90)
RDW: 13 % (ref 11.5–14.5)
WBC: 10.2 10*3/uL (ref 3.8–10.6)

## 2017-03-02 LAB — URINALYSIS, COMPLETE (UACMP) WITH MICROSCOPIC: SPECIFIC GRAVITY, URINE: 1.02 (ref 1.005–1.030)

## 2017-03-02 LAB — COMPREHENSIVE METABOLIC PANEL
ALK PHOS: 58 U/L (ref 38–126)
ALT: 21 U/L (ref 17–63)
AST: 25 U/L (ref 15–41)
Albumin: 3.7 g/dL (ref 3.5–5.0)
Anion gap: 8 (ref 5–15)
BILIRUBIN TOTAL: 0.9 mg/dL (ref 0.3–1.2)
BUN: 18 mg/dL (ref 6–20)
CHLORIDE: 106 mmol/L (ref 101–111)
CO2: 22 mmol/L (ref 22–32)
CREATININE: 1.1 mg/dL (ref 0.61–1.24)
Calcium: 9 mg/dL (ref 8.9–10.3)
GFR calc Af Amer: >60 mL/min (ref >60)
GLUCOSE: 137 mg/dL — AB (ref 65–99)
POTASSIUM: 3.7 mmol/L (ref 3.5–5.1)
SODIUM: 136 mmol/L (ref 135–145)
Total Protein: 7.6 g/dL (ref 6.5–8.1)

## 2017-03-02 LAB — CHLAMYDIA/NGC RT PCR (ARMC ONLY)
Chlamydia Tr: NOT DETECTED
N gonorrhoeae: NOT DETECTED

## 2017-03-02 LAB — LIPASE, BLOOD: Lipase: 73 U/L — ABNORMAL HIGH (ref 11–51)

## 2017-03-02 IMAGING — CT CT RENAL STONE PROTOCOL
2 of 4 series · 16 of 46 positions shown, 18 images · non-contrast
Comparison: 02/07/2017 and prior CTs dating back to 12/08/2013

CLINICAL DATA: 51-year-old male with acute left flank and abdominal
pain.

EXAM:
CT ABDOMEN AND PELVIS WITHOUT CONTRAST
TECHNIQUE: Multidetector CT imaging of the abdomen and pelvis was performed
following the standard protocol without IV contrast.

[Series 2: stone full standard · axial · 0.95mm/px · z∈[-1425,-985]mm · 13 of 98 slices shown, 15 images]
[im 5/98  soft-tissue]
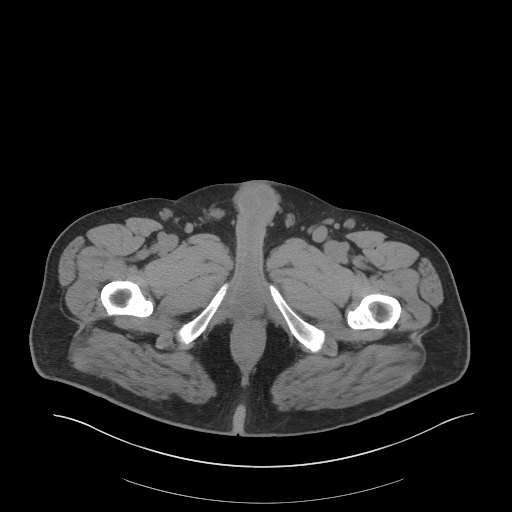
[im 5/98  bone]
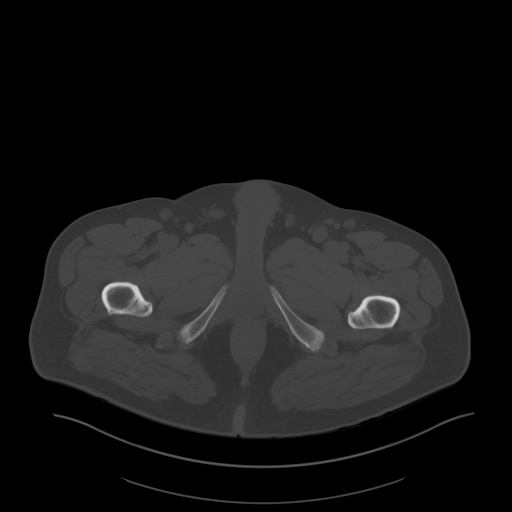
[im 13/98  soft-tissue]
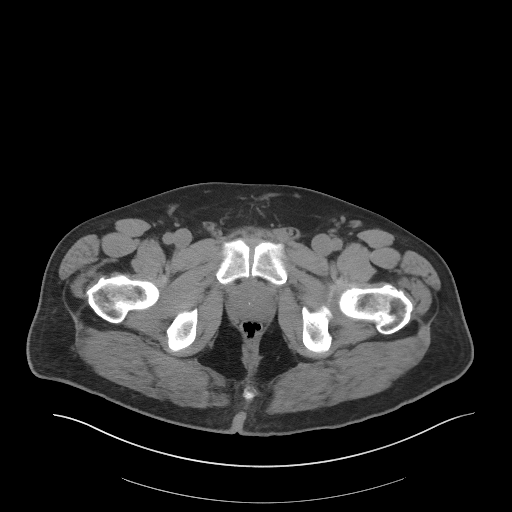
[im 21/98  soft-tissue]
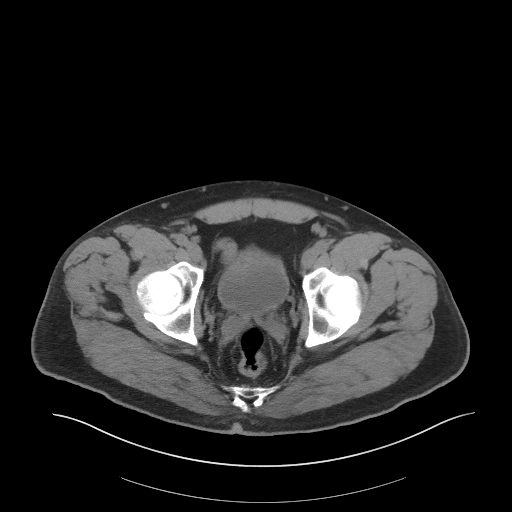
[im 29/98  soft-tissue]
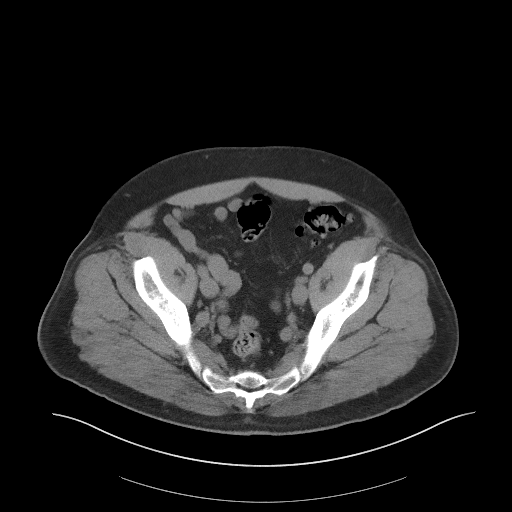
[im 33/98  soft-tissue]
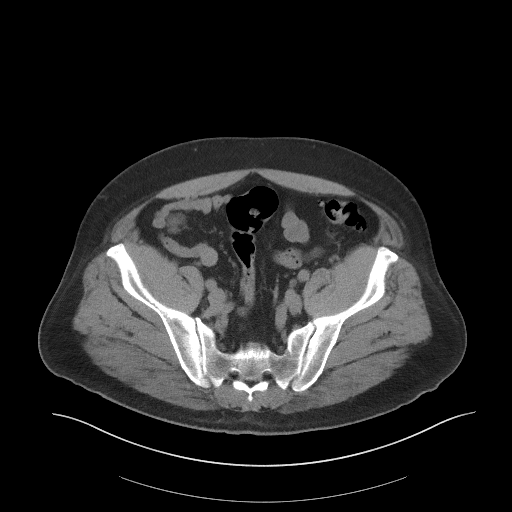
[im 41/98  soft-tissue]
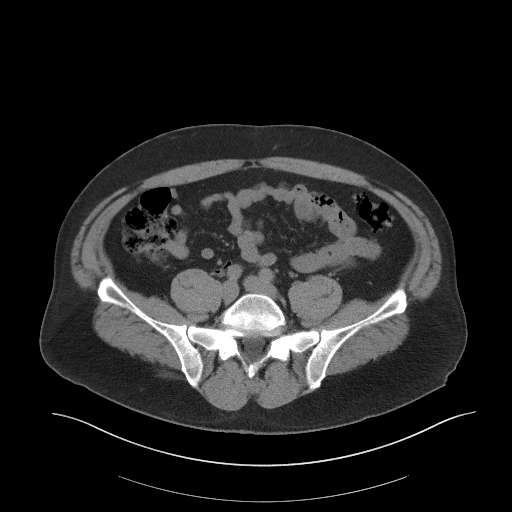
[im 49/98  soft-tissue]
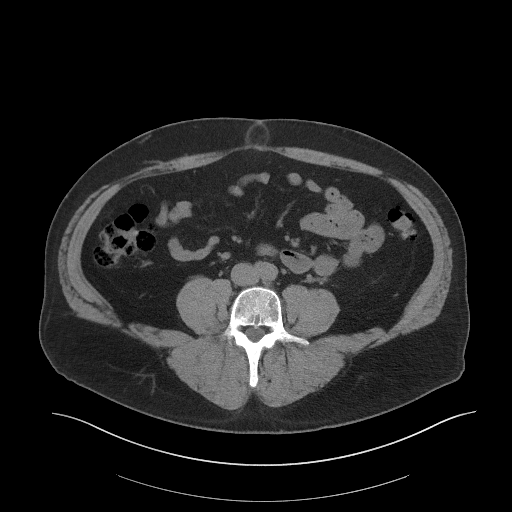
[im 57/98  soft-tissue]
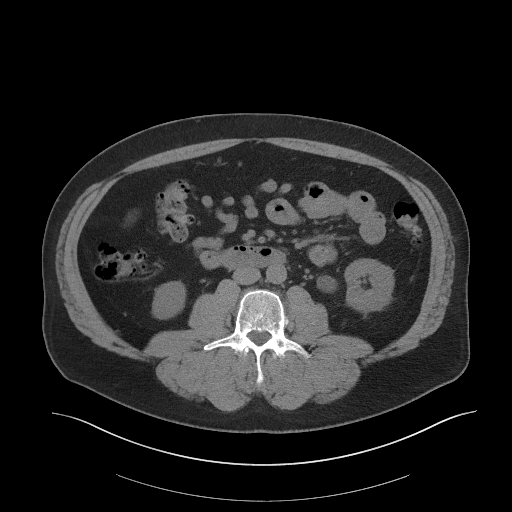
[im 65/98  soft-tissue]
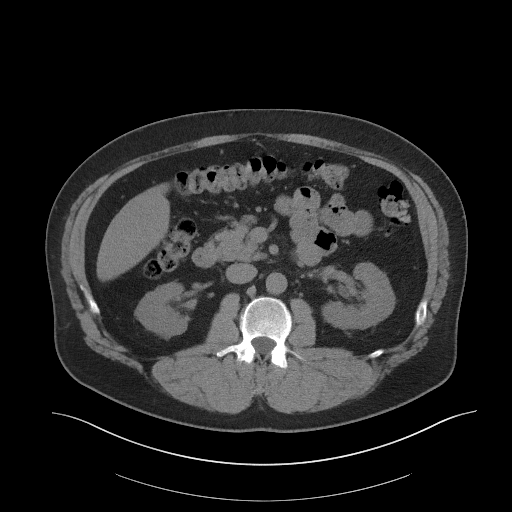
[im 65/98  bone]
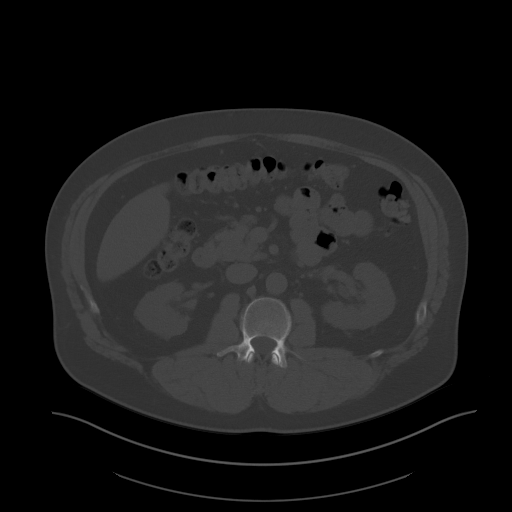
[im 69/98  soft-tissue]
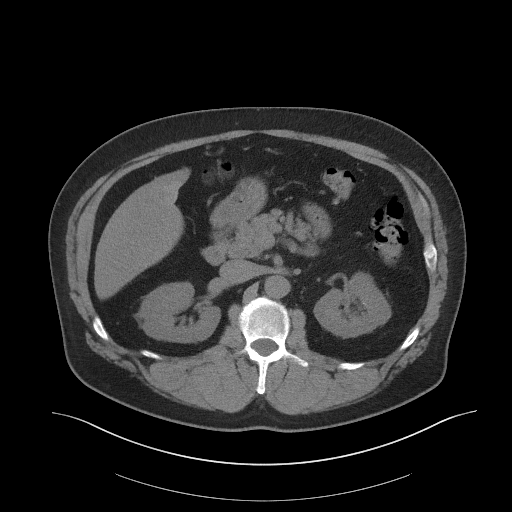
[im 77/98  soft-tissue]
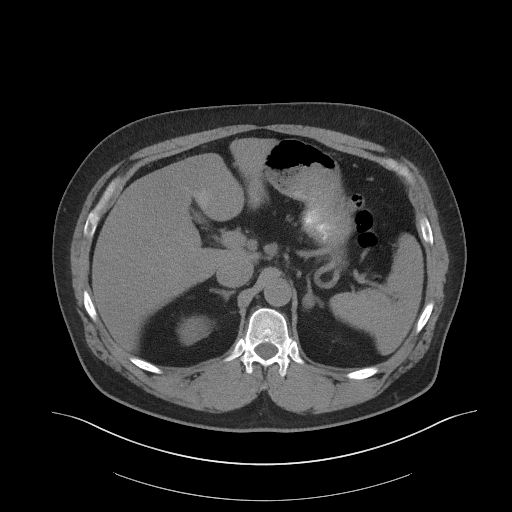
[im 85/98  soft-tissue]
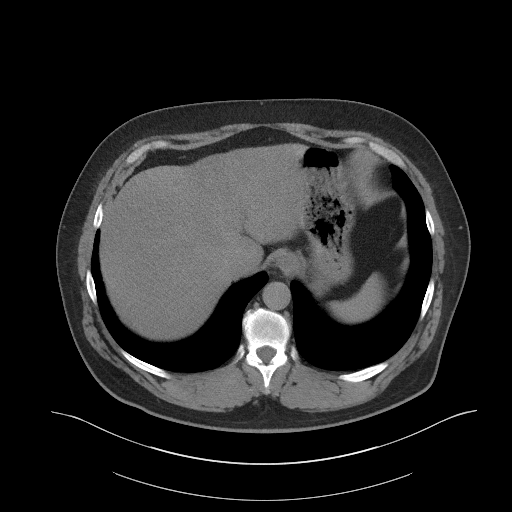
[im 93/98  soft-tissue]
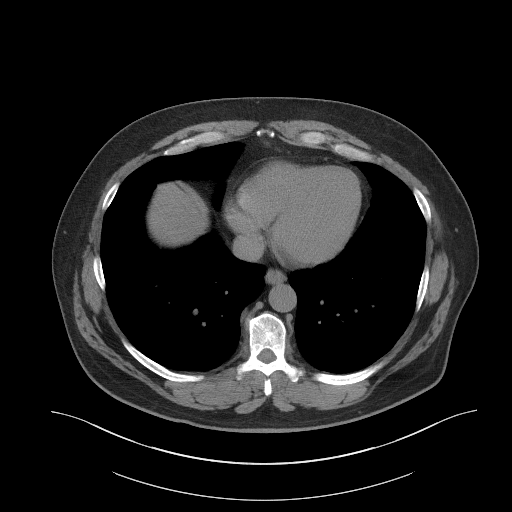

[Series 5: coronal · coronal · 0.82mm/px · 3 of 149 slices shown]
[im 50/149  soft-tissue]
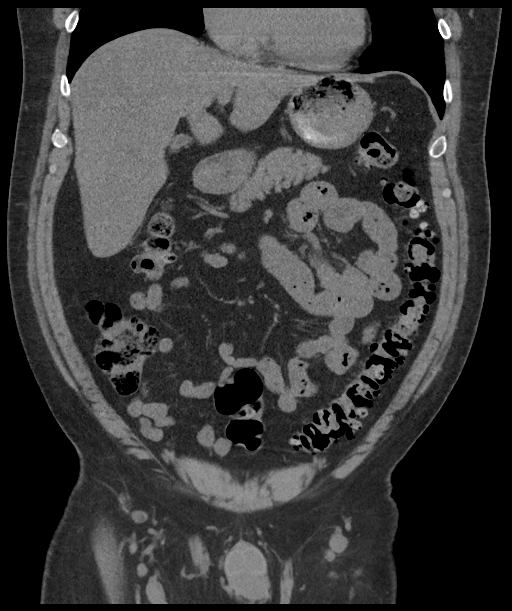
[im 66/149  soft-tissue]
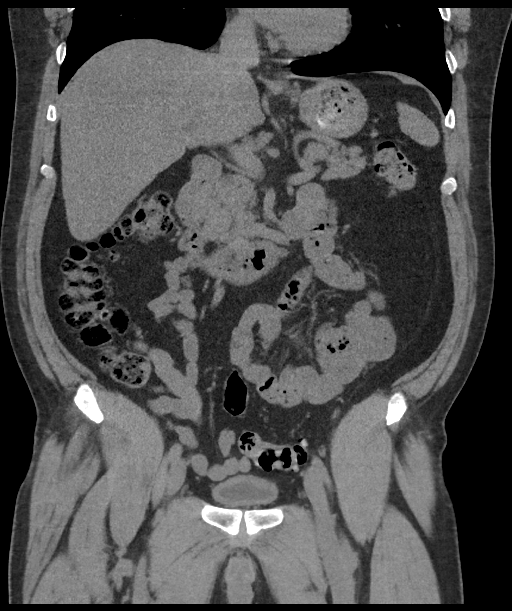
[im 83/149  soft-tissue]
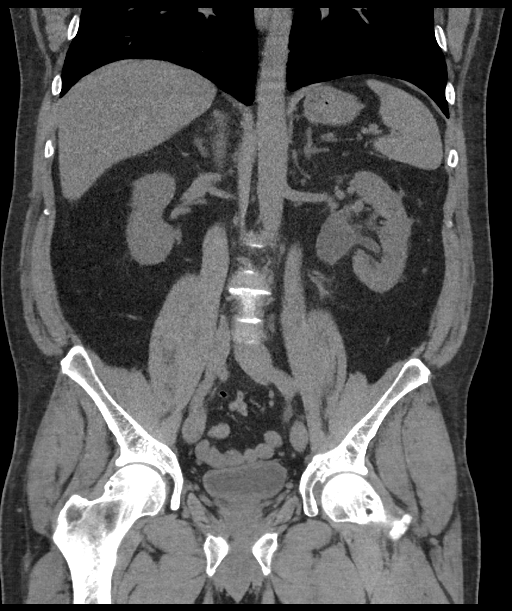

[16 of 46 positions shown; findings below may reference images not displayed]

FINDINGS: Please note that parenchymal abnormalities may be missed without
intravenous contrast.

Lower chest: No acute abnormality

Hepatobiliary: Hepatic steatosis again identified. Small stable
hypodense lesions within the liver are unchanged from 9893. The
gallbladder is unremarkable. No biliary dilatation.

Pancreas: Unremarkable

Spleen: Unremarkable

Adrenals/Urinary Tract: A 4 mm left UVJ calculus causes fullness of
the left ureter and renal collecting system. No other urinary
calculi are identified. The adrenal glands and bladder are
unremarkable except for a stable left adrenal adenoma.

Stomach/Bowel: Stomach is within normal limits. Appendix appears
normal. No evidence of bowel wall thickening, distention, or
inflammatory changes. Colonic diverticulosis noted without evidence
of diverticulitis.

Vascular/Lymphatic: Aortic atherosclerosis. No enlarged abdominal or
pelvic lymph nodes.

Reproductive: Mild prostate enlargement noted.

Other: No ascites, pneumoperitoneum or focal collection

Musculoskeletal: No acute abnormality or suspicious bony lesion.
IMPRESSION: 1. 4 mm left UVJ calculus causing fullness of the left renal
collecting system
2. Hepatic steatosis
3.  Aortic Atherosclerosis ([id number]-X71.1).

## 2017-03-02 MED ORDER — IBUPROFEN 600 MG PO TABS: 600.0000 mg | ORAL_TABLET | Freq: Four times a day (QID) | ORAL | 0 refills | Status: DC | PRN

## 2017-03-02 MED ORDER — CEPHALEXIN 500 MG PO CAPS: 500.0000 mg | ORAL_CAPSULE | Freq: Three times a day (TID) | ORAL | 0 refills | Status: AC

## 2017-03-02 MED ORDER — CEFTRIAXONE SODIUM IN DEXTROSE 20 MG/ML IV SOLN
INTRAVENOUS | Status: AC
  Administered 2017-03-02: 1000 mg
  Filled 2017-03-02: qty 50

## 2017-03-02 MED ORDER — TAMSULOSIN HCL 0.4 MG PO CAPS: 0.4000 mg | ORAL_CAPSULE | Freq: Every day | ORAL | 0 refills | Status: DC

## 2017-03-02 MED ORDER — OXYCODONE-ACETAMINOPHEN 5-325 MG PO TABS: 1.0000 | ORAL_TABLET | Freq: Four times a day (QID) | ORAL | 0 refills | Status: DC | PRN

## 2017-03-02 MED ORDER — AZITHROMYCIN 500 MG PO TABS
1000.0000 mg | ORAL_TABLET | Freq: Once | ORAL | Status: AC
  Administered 2017-03-02: 1000 mg via ORAL
  Filled 2017-03-02: qty 2

## 2017-03-02 MED ORDER — KETOROLAC TROMETHAMINE 30 MG/ML IJ SOLN
15.0000 mg | Freq: Once | INTRAMUSCULAR | Status: AC
  Administered 2017-03-02: 15 mg via INTRAVENOUS
  Filled 2017-03-02: qty 1

## 2017-03-02 MED ORDER — DEXTROSE 5 % IV SOLN
1.0000 g | Freq: Once | INTRAVENOUS | Status: DC
  Filled 2017-03-02: qty 10

## 2017-03-02 MED ORDER — DEXTROSE 5 % IV SOLN: 1.0000 g | Freq: Once | INTRAVENOUS | Status: DC

## 2017-03-02 NOTE — ED Provider Notes (Addendum)
[facility] Emergency Department Provider Note  ____________________________________________  Time seen: Approximately 4:17 PM  I have reviewed the triage vital signs and the nursing notes.   HISTORY  Chief Complaint Abdominal Pain and Flank Pain   HPI [name] is a 51 y.o. male the history of kidney stones and presents for evaluation of the left flank pain. Pain started today, though, severe, constant, located in the left flank, and radiating to the left lower quadrant. Patient also noted some milk color discharge from his penis that started this morning. No dysuria, no fever, no chills, no N/V, diarrhea or constipation. Patient had similar presentation one month ago with negative CT renal and was treated for STD however no testing was done. Patient started taking flomax and azo at home today. He is worried he has a UTI.  Past Medical History:  Diagnosis Date  . Hematuria   . History of kidney stones   . Left ureteral calculus   . Nephrolithiasis     There are no active problems to display for this patient.   Past Surgical History:  Procedure Laterality Date  . CYSTO/  URETEROSCOPY LASER LITHOTRIPSY WITH STONE EXTRACTION/  STENT PLACEMENT Right 2014   (raliegh)  . CYSTOSCOPY WITH URETEROSCOPY AND STENT PLACEMENT Left 12/23/2013   Procedure: LEFT URETEROSCOPY WITH STENT EXCHANGE, STONE EXTRACTION WITH BASKET;  Surgeon: Anner Crete, MD;  Location: [facility];  Service: Urology;  Laterality: Left;  . CYSTOSCOPY/RETROGRADE/URETEROSCOPY Left 12/08/2013   Procedure: CYSTOSCOPY/LEFT RETROGRADE PYELOGRAM/ INSERTION LEFT URETERAL STENT;  Surgeon: Anner Crete, MD;  Location: [facility];  Service: Urology;  Laterality: Left;    Prior to Admission medications   Medication Sig Start Date End Date Taking? Authorizing Provider  aspirin 81 MG EC tablet Take 1 tablet (81 mg total) by mouth daily. Swallow whole. 09/20/16   Emily Filbert, MD   cephALEXin (KEFLEX) 500 MG capsule Take 1 capsule (500 mg total) by mouth 3 (three) times daily. 03/02/17 03/09/17  Nita Sickle, MD  ciprofloxacin (CIPRO) 500 MG tablet Take 1 tablet (500 mg total) by mouth 2 (two) times daily. Patient not taking: Reported on 09/20/2016 12/08/13   Bjorn Pippin, MD  HYDROcodone-acetaminophen [facility]) 5-325 MG tablet Take 1 tablet by mouth every 4 (four) hours as needed for moderate pain. DO NOT fill without accompanied acyclovir 02/07/17   Willy Eddy, MD  ibuprofen (ADVIL,MOTRIN) 600 MG tablet Take 1 tablet (600 mg total) by mouth every 6 (six) hours as needed. 03/02/17   Nita Sickle, MD  Multiple Vitamin (MULTIVITAMIN WITH MINERALS) TABS tablet Take 1 tablet by mouth daily.    [provider]  naproxen (NAPROSYN) 500 MG tablet Take 1 tablet (500 mg total) by mouth 2 (two) times daily with a meal. Patient not taking: Reported on 09/20/2016 01/24/16   Tommi Rumps, PA-C  naproxen sodium (ANAPROX) 220 MG tablet Take 440 mg by mouth 2 (two) times daily as needed (pain).    [provider]  ondansetron (ZOFRAN) 4 MG tablet Take 1 tablet (4 mg total) by mouth every 8 (eight) hours as needed for nausea or vomiting. 06/26/16   Jeanmarie Plant, MD  oxyCODONE-acetaminophen (ROXICET) 5-325 MG tablet Take 1 tablet by mouth every 6 (six) hours as needed. 03/02/17 03/02/18  Nita Sickle, MD  phenazopyridine (PYRIDIUM) 100 MG tablet Take 1 tablet (100 mg total) by mouth 3 (three) times daily as needed for pain. 02/07/17 02/07/18  Willy Eddy, MD  phenazopyridine (PYRIDIUM) 200 MG tablet Take 1 tablet (200 mg total) by mouth 3 (three) times daily as needed for pain. Patient not taking: Reported on 09/20/2016 12/08/13   Bjorn Pippin, MD  predniSONE (STERAPRED UNI-PAK 21 TAB) 10 MG (21) TBPK tablet Take by mouth daily. Dispense steroid taper pack as instructed 09/20/16   Emily Filbert, MD  promethazine (PHENERGAN) 25 MG tablet Take 1  tablet (25 mg total) by mouth every 6 (six) hours as needed for nausea or vomiting. Patient not taking: Reported on 09/20/2016 12/08/13   Bjorn Pippin, MD  tamsulosin (FLOMAX) 0.4 MG CAPS capsule Take 1 capsule (0.4 mg total) by mouth daily. 03/02/17   Nita Sickle, MD    Allergies Patient has no known allergies.  History reviewed. No pertinent family history.  Social History Social History  Substance Use Topics  . Smoking status: Current Every Day Smoker    Packs/day: 0.50    Types: Cigarettes  . Smokeless tobacco: Never Used  . Alcohol use No    Review of Systems  Constitutional: Negative for fever. Eyes: Negative for visual changes. ENT: Negative for sore throat. Neck: No neck pain  Cardiovascular: Negative for chest pain. Respiratory: Negative for shortness of breath. Gastrointestinal: Negative for abdominal pain, vomiting or diarrhea. Genitourinary: Negative for dysuria. + L flank pain and penile discharge Musculoskeletal: Negative for back pain. Skin: Negative for rash. Neurological: Negative for headaches, weakness or numbness. Psych: No SI or HI  ____________________________________________   PHYSICAL EXAM:  VITAL SIGNS: ED Triage Vitals  Enc Vitals Group     BP 03/02/17 1348 127/82     Pulse Rate 03/02/17 1348 95     Resp 03/02/17 1348 19     Temp 03/02/17 1348 98.4 F (36.9 C)     Temp Source 03/02/17 1348 Oral     SpO2 03/02/17 1348 99 %     Weight 03/02/17 1348 240 lb (108.9 kg)     Height 03/02/17 1348 5\' 8"  (1.727 m)     Head Circumference --      Peak Flow --      Pain Score 03/02/17 1346 9     Pain Loc --      Pain Edu? --      Excl. in GC? --     Constitutional: Alert and oriented. Well appearing and in no apparent distress. HEENT:      Head: Normocephalic and atraumatic.         Eyes: Conjunctivae are normal. Sclera is non-icteric.       Mouth/Throat: Mucous membranes are moist.       Neck: Supple with no signs of  meningismus. Cardiovascular: Regular rate and rhythm. No murmurs, gallops, or rubs. 2+ symmetrical distal pulses are present in all extremities. No JVD. Respiratory: Normal respiratory effort. Lungs are clear to auscultation bilaterally. No wheezes, crackles, or rhonchi.  Gastrointestinal: Soft, non tender, and non distended with positive bowel sounds. No rebound or guarding. Genitourinary: No CVA tenderness. Bilateral testicles are descended with no tenderness to palpation, bilateral positive cremasteric reflexes are present, no swelling or erythema of the scrotum. No evidence of inguinal hernia. No penile discharge Musculoskeletal: Nontender with normal range of motion in all extremities. No edema, cyanosis, or erythema of extremities. Neurologic: Normal speech and language. Face is symmetric. Moving all extremities. No gross focal neurologic deficits are appreciated. Skin: Skin is warm, dry and intact. No rash noted. Psychiatric: Mood and affect are normal. Speech and behavior are normal.  ____________________________________________   LABS (all labs ordered are listed, but only abnormal results are displayed)  Labs Reviewed  LIPASE, BLOOD - Abnormal; Notable for the following:       Result Value   Lipase 73 (*)    All other components within normal limits  COMPREHENSIVE METABOLIC PANEL - Abnormal; Notable for the following:    Glucose, Bld 137 (*)    All other components within normal limits  URINALYSIS, COMPLETE (UACMP) WITH MICROSCOPIC - Abnormal; Notable for the following:    Color, Urine ORANGE (*)    APPearance HAZY (*)    Glucose, UA   (*)    Value: TEST NOT REPORTED DUE TO COLOR INTERFERENCE OF URINE PIGMENT   Hgb urine dipstick   (*)    Value: TEST NOT REPORTED DUE TO COLOR INTERFERENCE OF URINE PIGMENT   Bilirubin Urine   (*)    Value: TEST NOT REPORTED DUE TO COLOR INTERFERENCE OF URINE PIGMENT   Ketones, ur   (*)    Value: TEST NOT REPORTED DUE TO COLOR INTERFERENCE OF  URINE PIGMENT   Protein, ur   (*)    Value: TEST NOT REPORTED DUE TO COLOR INTERFERENCE OF URINE PIGMENT   Nitrite   (*)    Value: TEST NOT REPORTED DUE TO COLOR INTERFERENCE OF URINE PIGMENT   Leukocytes, UA   (*)    Value: TEST NOT REPORTED DUE TO COLOR INTERFERENCE OF URINE PIGMENT   Squamous Epithelial / LPF 0-5 (*)    Bacteria, UA RARE (*)    All other components within normal limits  URINE CULTURE  CHLAMYDIA/NGC RT PCR ([facility] ONLY)  CBC   ____________________________________________  EKG  None ____________________________________________  RADIOLOGY  CT renal:  1. 4 mm left UVJ calculus causing fullness of the left renal collecting system 2. Hepatic steatosis 3. Aortic Atherosclerosis (ICD10-I70.0). ____________________________________________   PROCEDURES  Procedure(s) performed: None Procedures Critical Care performed:  None ____________________________________________   INITIAL IMPRESSION / ASSESSMENT AND PLAN / ED COURSE  51 y.o. male the history of kidney stones and presents for evaluation of the left flank pain and penile discharge. GU exam is unremarkable with no obvious discharge. Gonorrhea and chlamydia are pending. We'll treat with Rocephin and azithromycin for possible STD/urethritis. UA is limited due to patient taking Azo showing rare bacteria and too numerous to count WBC and RBC. CT concerning 4 mm obstructing stone. Labs WNL. Will give toradol, flomax, will place patient on keflex in case he is developing a UTI, will send urine for culture. With normal labs, no fever, normal vitals, no N/V, and inconclusive UA I do believe patient is safe for discharge and outpatient management. Discussed return precautions with the patient.      As part of my medical decision making, I reviewed the following data within the electronic MEDICAL RECORD NUMBER Nursing notes reviewed and incorporated, Labs reviewed , Old chart reviewed, Radiograph reviewed , Notes  from prior ED visits and [place] Controlled Substance Database    Pertinent labs & imaging results that were available during my care of the patient were reviewed by me and considered in my medical decision making (see chart for details).    ____________________________________________   FINAL CLINICAL IMPRESSION(S) / ED DIAGNOSES  Final diagnoses:  Kidney stone  Urethritis      NEW MEDICATIONS STARTED DURING THIS VISIT:  New Prescriptions   CEPHALEXIN (KEFLEX) 500 MG CAPSULE    Take 1 capsule (500 mg total) by mouth 3 (three) times  daily.   IBUPROFEN (ADVIL,MOTRIN) 600 MG TABLET    Take 1 tablet (600 mg total) by mouth every 6 (six) hours as needed.   OXYCODONE-ACETAMINOPHEN (ROXICET) 5-325 MG TABLET    Take 1 tablet by mouth every 6 (six) hours as needed.   TAMSULOSIN (FLOMAX) 0.4 MG CAPS CAPSULE    Take 1 capsule (0.4 mg total) by mouth daily.     Note:  This document was prepared using Dragon voice recognition software and may include unintentional dictation errors.    Nita Sickle, MD 03/02/17 1637       Nita Sickle, MD 03/02/17 2149

## 2017-03-02 NOTE — ED Triage Notes (Signed)
Pt presents to ED c/o left flank and abdominal pain radiating to lower back. Pt states he has been seen before for discharge and was treated but symptoms persist

## 2017-03-02 NOTE — Discharge Instructions (Signed)

## 2017-03-03 LAB — URINE CULTURE

## 2017-03-07 ENCOUNTER — Telehealth: Payer: Self-pay | Admitting: Emergency Medicine

## 2017-03-07 NOTE — Telephone Encounter (Signed)
Called patient back with results of std testing and urine culture.  Says he continues to have symptoms and has not made follow up plans due to no insurance.  I advised him to follow up as instructed.  I explained that he needs to tell them he has no insurance and make a payment plan.  He agrees to make appointment.   I also discussed pcp options in the area including [facility] and [facility].

## 2017-03-30 ENCOUNTER — Encounter: Payer: Self-pay | Admitting: Emergency Medicine

## 2017-03-30 ENCOUNTER — Emergency Department
Admission: EM | Admit: 2017-03-30 | Discharge: 2017-03-30 | Disposition: A | Discharge status: Home / Self Care | Payer: Self-pay | Attending: Emergency Medicine | Admitting: Emergency Medicine

## 2017-03-30 DIAGNOSIS — L0231 Cutaneous abscess of buttock: Secondary | ICD-10-CM | POA: Insufficient documentation

## 2017-03-30 DIAGNOSIS — Z7982 Long term (current) use of aspirin: Secondary | ICD-10-CM | POA: Insufficient documentation

## 2017-03-30 DIAGNOSIS — F17210 Nicotine dependence, cigarettes, uncomplicated: Secondary | ICD-10-CM | POA: Insufficient documentation

## 2017-03-30 DIAGNOSIS — Z79899 Other long term (current) drug therapy: Secondary | ICD-10-CM | POA: Insufficient documentation

## 2017-03-30 MED ORDER — SULFAMETHOXAZOLE-TRIMETHOPRIM 800-160 MG PO TABS
1.0000 | ORAL_TABLET | Freq: Two times a day (BID) | ORAL | 0 refills | Status: DC
Start: 2017-03-30 — End: 2017-08-09

## 2017-03-30 MED ORDER — OXYCODONE-ACETAMINOPHEN 5-325 MG PO TABS
1.0000 | ORAL_TABLET | Freq: Once | ORAL | Status: AC
  Administered 2017-03-30: 1 via ORAL
  Filled 2017-03-30: qty 1

## 2017-03-30 MED ORDER — IBUPROFEN 800 MG PO TABS: 800.0000 mg | ORAL_TABLET | Freq: Three times a day (TID) | ORAL | 0 refills | Status: DC | PRN

## 2017-03-30 MED ORDER — LIDOCAINE HCL (PF) 1 % IJ SOLN
INTRAMUSCULAR | Status: AC
  Filled 2017-03-30: qty 5

## 2017-03-30 MED ORDER — OXYCODONE-ACETAMINOPHEN 5-325 MG PO TABS: 1.0000 | ORAL_TABLET | Freq: Four times a day (QID) | ORAL | 0 refills | Status: DC | PRN

## 2017-03-30 MED ORDER — IBUPROFEN 800 MG PO TABS
800.0000 mg | ORAL_TABLET | Freq: Once | ORAL | Status: AC
  Administered 2017-03-30: 800 mg via ORAL
  Filled 2017-03-30: qty 1

## 2017-03-30 NOTE — ED Provider Notes (Signed)
[facility] Emergency Department Provider Note   ____________________________________________   First MD Initiated Contact with Patient 03/30/17 1131     (approximate)  I have reviewed the triage vital signs and the nursing notes.   HISTORY  Chief Complaint Abscess    HPI [name] is a 51 y.o. male patient complain of increasing abscess to the right buttocks. Patient state onset of complaint was 2 days ago. Patient stated no relief with home remedies.Patient has a history of recurrent staph infections. Patient rates his pain as a 9/10. Patient described a pain as "achy".   Past Medical History:  Diagnosis Date  . Hematuria   . History of kidney stones   . Left ureteral calculus   . Nephrolithiasis     There are no active problems to display for this patient.   Past Surgical History:  Procedure Laterality Date  . CYSTO/  URETEROSCOPY LASER LITHOTRIPSY WITH STONE EXTRACTION/  STENT PLACEMENT Right 2014   (raliegh)  . CYSTOSCOPY WITH URETEROSCOPY AND STENT PLACEMENT Left 12/23/2013   Procedure: LEFT URETEROSCOPY WITH STENT EXCHANGE, STONE EXTRACTION WITH BASKET;  Surgeon: Anner CreteJohn J Wrenn, MD;  [facility];  Service: Urology;  Laterality: Left;  . CYSTOSCOPY/RETROGRADE/URETEROSCOPY Left 12/08/2013   Procedure: CYSTOSCOPY/LEFT RETROGRADE PYELOGRAM/ INSERTION LEFT URETERAL STENT;  Surgeon: Anner CreteJohn J Wrenn, MD;  Location: [facility];  Service: Urology;  Laterality: Left;    Prior to Admission medications   Medication Sig Start Date End Date Taking? Authorizing Provider  aspirin 81 MG EC tablet Take 1 tablet (81 mg total) by mouth daily. Swallow whole. 09/20/16   Emily FilbertWilliams, Jonathan E, MD  ciprofloxacin (CIPRO) 500 MG tablet Take 1 tablet (500 mg total) by mouth 2 (two) times daily. Patient not taking: Reported on 09/20/2016 12/08/13   Bjorn PippinWrenn, John, MD  HYDROcodone-acetaminophen [facility](NORCO) 5-325 MG tablet Take 1 tablet by mouth every 4  (four) hours as needed for moderate pain. DO NOT fill without accompanied acyclovir 02/07/17   Willy Eddyobinson, Patrick, MD  ibuprofen (ADVIL,MOTRIN) 600 MG tablet Take 1 tablet (600 mg total) by mouth every 6 (six) hours as needed. 03/02/17   Nita SickleVeronese, [place], MD  ibuprofen (ADVIL,MOTRIN) 800 MG tablet Take 1 tablet (800 mg total) by mouth every 8 (eight) hours as needed for moderate pain. 03/30/17   Joni ReiningSmith, Ronald K, PA-C  Multiple Vitamin (MULTIVITAMIN WITH MINERALS) TABS tablet Take 1 tablet by mouth daily.    [provider]  naproxen (NAPROSYN) 500 MG tablet Take 1 tablet (500 mg total) by mouth 2 (two) times daily with a meal. Patient not taking: Reported on 09/20/2016 01/24/16   Tommi RumpsSummers, Rhonda L, PA-C  naproxen sodium (ANAPROX) 220 MG tablet Take 440 mg by mouth 2 (two) times daily as needed (pain).    [provider]  ondansetron (ZOFRAN) 4 MG tablet Take 1 tablet (4 mg total) by mouth every 8 (eight) hours as needed for nausea or vomiting. 06/26/16   Jeanmarie PlantMcShane, James A, MD  oxyCODONE-acetaminophen (ROXICET) 5-325 MG tablet Take 1 tablet by mouth every 6 (six) hours as needed. 03/02/17 03/02/18  Nita SickleVeronese, [place], MD  oxyCODONE-acetaminophen (ROXICET) 5-325 MG tablet Take 1 tablet by mouth every 6 (six) hours as needed. 03/30/17 03/30/18  Joni ReiningSmith, Ronald K, PA-C  phenazopyridine (PYRIDIUM) 100 MG tablet Take 1 tablet (100 mg total) by mouth 3 (three) times daily as needed for pain. 02/07/17 02/07/18  Willy Eddyobinson, Patrick, MD  phenazopyridine (PYRIDIUM) 200 MG tablet Take 1 tablet (200 mg  total) by mouth 3 (three) times daily as needed for pain. Patient not taking: Reported on 09/20/2016 12/08/13   Bjorn PippinWrenn, John, MD  predniSONE (STERAPRED UNI-PAK 21 TAB) 10 MG (21) TBPK tablet Take by mouth daily. Dispense steroid taper pack as instructed 09/20/16   Emily FilbertWilliams, Jonathan E, MD  promethazine (PHENERGAN) 25 MG tablet Take 1 tablet (25 mg total) by mouth every 6 (six) hours as needed for nausea or  vomiting. Patient not taking: Reported on 09/20/2016 12/08/13   Bjorn PippinWrenn, John, MD  sulfamethoxazole-trimethoprim (BACTRIM DS,SEPTRA DS) 800-160 MG tablet Take 1 tablet by mouth 2 (two) times daily. 03/30/17   Joni ReiningSmith, Ronald K, PA-C  tamsulosin (FLOMAX) 0.4 MG CAPS capsule Take 1 capsule (0.4 mg total) by mouth daily. 03/02/17   Nita SickleVeronese, [place], MD    Allergies Patient has no known allergies.  No family history on file.  Social History Social History   Tobacco Use  . Smoking status: Current Every Day Smoker    Packs/day: 0.50    Types: Cigarettes  . Smokeless tobacco: Never Used  Substance Use Topics  . Alcohol use: No  . Drug use: No    Review of Systems  Constitutional: No fever/chills Eyes: No visual changes. ENT: No sore throat. Cardiovascular: Denies chest pain. Respiratory: Denies shortness of breath. Gastrointestinal: No abdominal pain.  No nausea, no vomiting.  No diarrhea.  No constipation. Genitourinary: Negative for dysuria. Musculoskeletal: Negative for back pain. Skin: Redness swelling to the right buttocks Neurological: Negative for headaches, focal weakness or numbness.  ____________________________________________   PHYSICAL EXAM:  VITAL SIGNS: ED Triage Vitals  Enc Vitals Group     BP 03/30/17 1106 127/82     Pulse Rate 03/30/17 1106 98     Resp 03/30/17 1106 16     Temp 03/30/17 1106 98.4 F (36.9 C)     Temp Source 03/30/17 1106 Oral     SpO2 03/30/17 1106 97 %     Weight 03/30/17 1102 240 lb (108.9 kg)     Height 03/30/17 1102 5\' 8"  (1.727 m)     Head Circumference --      Peak Flow --      Pain Score 03/30/17 1102 9     Pain Loc --      Pain Edu? --      Excl. in GC? --    Constitutional: Alert and oriented. Well appearing and in no acute distress. Hematological/Lymphatic/Immunilogical: No cervical lymphadenopathy. Cardiovascular: Normal rate, regular rhythm. Grossly normal heart sounds.  Good peripheral circulation. Respiratory: Normal  respiratory effort.  No retractions. Lungs CTAB. Gastrointestinal: Soft and nontender. No distention. No abdominal bruits. No CVA tenderness. Musculoskeletal: No lower extremity tenderness nor edema.  No joint effusions. Neurologic:  Normal speech and language. No gross focal neurologic deficits are appreciated. No gait instability. Skin:  Edematous erythematous nausea lesion right buttocks Psychiatric: Mood and affect are normal. Speech and behavior are normal.  ____________________________________________   LABS (all labs ordered are listed, but only abnormal results are displayed)  Labs Reviewed - No data to display ____________________________________________  EKG   ____________________________________________  RADIOLOGY  No results found.  ____________________________________________   PROCEDURES  Procedure(s) performed: None  .Marland Kitchen.Incision and Drainage Date/Time: 03/30/2017 12:24 PM Performed by: Joni ReiningSmith, Ronald K, PA-C Authorized by: Joni ReiningSmith, Ronald K, PA-C   Consent:    Consent obtained:  Verbal   Consent given by:  Patient   Risks discussed:  Bleeding, incomplete drainage, infection and pain Location:    Type:  Abscess   Size:  3   Location:  Lower extremity   Lower extremity location:  Buttock   Buttock location:  R buttock Pre-procedure details:    Skin preparation:  Betadine Procedure type:    Complexity:  Complex Procedure details:    Incision types:  Single with marsupialization   Incision depth:  Subcutaneous   Scalpel blade:  10   Wound management:  Probed and deloculated and irrigated with saline   Drainage:  Bloody and purulent   Drainage amount:  Moderate   Wound treatment:  Drain placed   Packing materials:  1/4 in iodoform gauze Post-procedure details:    Patient tolerance of procedure:  Tolerated well, no immediate complications    Critical Care performed: No  ____________________________________________   INITIAL IMPRESSION /  ASSESSMENT AND PLAN / ED COURSE  As part of my medical decision making, I reviewed the following data within the electronic MEDICAL RECORD NUMBER    Patient does have pain and swelling to the right buttocks secondary to abscess. Area was incised and drained. Patient given discharge care instructions. Patient advised to take medication as directed and return back in 2 days for wound check.      ____________________________________________   FINAL CLINICAL IMPRESSION(S) / ED DIAGNOSES  Final diagnoses:  Abscess of buttock, right     ED Discharge Orders        Ordered    sulfamethoxazole-trimethoprim (BACTRIM DS,SEPTRA DS) 800-160 MG tablet  2 times daily     03/30/17 1222    oxyCODONE-acetaminophen (ROXICET) 5-325 MG tablet  Every 6 hours PRN     03/30/17 1222    ibuprofen (ADVIL,MOTRIN) 800 MG tablet  Every 8 hours PRN     03/30/17 1222       Note:  This document was prepared using Dragon voice recognition software and may include unintentional dictation errors.    Joni Reining, PA-C 03/30/17 1228    Sharyn Creamer, MD 03/30/17 [phone]

## 2017-03-30 NOTE — ED Triage Notes (Addendum)
Patient presents to the ED with large abscess to right buttocks.  Patient ambulatory to triage.  Patient is in no obvious distress at this time.  Patient states he noticed area 2 days ago and states, "I've tried all the home remedies and they aren't working."

## 2017-04-04 LAB — AEROBIC/ANAEROBIC CULTURE (SURGICAL/DEEP WOUND)

## 2017-04-04 LAB — AEROBIC/ANAEROBIC CULTURE W GRAM STAIN (SURGICAL/DEEP WOUND)

## 2017-04-26 ENCOUNTER — Emergency Department
Admission: EM | Admit: 2017-04-26 | Discharge: 2017-04-26 | Disposition: A | Discharge status: Home / Self Care | Payer: Self-pay | Attending: Emergency Medicine | Admitting: Emergency Medicine

## 2017-04-26 ENCOUNTER — Other Ambulatory Visit: Payer: Self-pay

## 2017-04-26 DIAGNOSIS — L0231 Cutaneous abscess of buttock: Secondary | ICD-10-CM | POA: Insufficient documentation

## 2017-04-26 DIAGNOSIS — F17210 Nicotine dependence, cigarettes, uncomplicated: Secondary | ICD-10-CM | POA: Insufficient documentation

## 2017-04-26 DIAGNOSIS — Z79899 Other long term (current) drug therapy: Secondary | ICD-10-CM | POA: Insufficient documentation

## 2017-04-26 DIAGNOSIS — Z7982 Long term (current) use of aspirin: Secondary | ICD-10-CM | POA: Insufficient documentation

## 2017-04-26 MED ORDER — SULFAMETHOXAZOLE-TRIMETHOPRIM 200-40 MG/5ML PO SUSP: 5.0000 mL | Freq: Two times a day (BID) | ORAL | 0 refills | Status: DC

## 2017-04-26 MED ORDER — OXYCODONE-ACETAMINOPHEN 5-325 MG PO TABS
1.0000 | ORAL_TABLET | Freq: Once | ORAL | Status: AC
  Administered 2017-04-26: 1 via ORAL
  Filled 2017-04-26: qty 1

## 2017-04-26 MED ORDER — IBUPROFEN 800 MG PO TABS: 800.0000 mg | ORAL_TABLET | Freq: Three times a day (TID) | ORAL | 0 refills | Status: DC | PRN

## 2017-04-26 MED ORDER — OXYCODONE-ACETAMINOPHEN 7.5-325 MG PO TABS: 1.0000 | ORAL_TABLET | Freq: Four times a day (QID) | ORAL | 0 refills | Status: DC | PRN

## 2017-04-26 MED ORDER — SULFAMETHOXAZOLE-TRIMETHOPRIM 800-160 MG PO TABS
1.0000 | ORAL_TABLET | Freq: Once | ORAL | Status: AC
  Administered 2017-04-26: 1 via ORAL
  Filled 2017-04-26: qty 1

## 2017-04-26 MED ORDER — IBUPROFEN 800 MG PO TABS
800.0000 mg | ORAL_TABLET | Freq: Once | ORAL | Status: AC
  Administered 2017-04-26: 800 mg via ORAL
  Filled 2017-04-26: qty 1

## 2017-04-26 NOTE — ED Provider Notes (Signed)
[facility] Emergency Department Provider Note   ____________________________________________   None    (approximate)  I have reviewed the triage vital signs and the nursing notes.   HISTORY  Chief Complaint Abscess    HPI [name] is a 51 y.o. male patient with pain and edema to right buttocks for 3 days. Patient was seen last monthfor same complaint requiring incision and drainage. Patient was advised follow surgical clinic but did not comply. Patient denies drainage from site of complaint. Patient denies fever. Patient rates pain as 8/10. Patient described a pain as "achy". No palliative measures for complaint.   Past Medical History:  Diagnosis Date  . Hematuria   . History of kidney stones   . Left ureteral calculus   . Nephrolithiasis     There are no active problems to display for this patient.   Past Surgical History:  Procedure Laterality Date  . CYSTO/  URETEROSCOPY LASER LITHOTRIPSY WITH STONE EXTRACTION/  STENT PLACEMENT Right 2014   (raliegh)  . CYSTOSCOPY WITH URETEROSCOPY AND STENT PLACEMENT Left 12/23/2013   Procedure: LEFT URETEROSCOPY WITH STENT EXCHANGE, STONE EXTRACTION WITH BASKET;  Surgeon: Anner Crete, MD;  Location: [facility];  Service: Urology;  Laterality: Left;  . CYSTOSCOPY/RETROGRADE/URETEROSCOPY Left 12/08/2013   Procedure: CYSTOSCOPY/LEFT RETROGRADE PYELOGRAM/ INSERTION LEFT URETERAL STENT;  Surgeon: Anner Crete, MD;  Location: [facility];  Service: Urology;  Laterality: Left;    Prior to Admission medications   Medication Sig Start Date End Date Taking? Authorizing Provider  aspirin 81 MG EC tablet Take 1 tablet (81 mg total) by mouth daily. Swallow whole. 09/20/16   Emily Filbert, MD  ciprofloxacin (CIPRO) 500 MG tablet Take 1 tablet (500 mg total) by mouth 2 (two) times daily. Patient not taking: Reported on 09/20/2016 12/08/13   Bjorn Pippin, MD  HYDROcodone-acetaminophen [facility]) 5-325 MG  tablet Take 1 tablet by mouth every 4 (four) hours as needed for moderate pain. DO NOT fill without accompanied acyclovir 02/07/17   Willy Eddy, MD  ibuprofen (ADVIL,MOTRIN) 600 MG tablet Take 1 tablet (600 mg total) by mouth every 6 (six) hours as needed. 03/02/17   Nita Sickle, MD  ibuprofen (ADVIL,MOTRIN) 800 MG tablet Take 1 tablet (800 mg total) by mouth every 8 (eight) hours as needed for moderate pain. 03/30/17   Joni Reining, PA-C  ibuprofen (ADVIL,MOTRIN) 800 MG tablet Take 1 tablet (800 mg total) by mouth every 8 (eight) hours as needed. 04/26/17   Joni Reining, PA-C  Multiple Vitamin (MULTIVITAMIN WITH MINERALS) TABS tablet Take 1 tablet by mouth daily.    [provider]  naproxen (NAPROSYN) 500 MG tablet Take 1 tablet (500 mg total) by mouth 2 (two) times daily with a meal. Patient not taking: Reported on 09/20/2016 01/24/16   Tommi Rumps, PA-C  naproxen sodium (ANAPROX) 220 MG tablet Take 440 mg by mouth 2 (two) times daily as needed (pain).    [provider]  ondansetron (ZOFRAN) 4 MG tablet Take 1 tablet (4 mg total) by mouth every 8 (eight) hours as needed for nausea or vomiting. 06/26/16   Jeanmarie Plant, MD  oxyCODONE-acetaminophen (PERCOCET) 7.5-325 MG tablet Take 1 tablet by mouth every 6 (six) hours as needed for severe pain. 04/26/17   Joni Reining, PA-C  oxyCODONE-acetaminophen (ROXICET) 5-325 MG tablet Take 1 tablet by mouth every 6 (six) hours as needed. 03/02/17 03/02/18  Nita Sickle, MD  oxyCODONE-acetaminophen (ROXICET) [phone]  MG tablet Take 1 tablet by mouth every 6 (six) hours as needed. 03/30/17 03/30/18  Joni Reining, PA-C  phenazopyridine (PYRIDIUM) 100 MG tablet Take 1 tablet (100 mg total) by mouth 3 (three) times daily as needed for pain. 02/07/17 02/07/18  Willy Eddy, MD  phenazopyridine (PYRIDIUM) 200 MG tablet Take 1 tablet (200 mg total) by mouth 3 (three) times daily as needed for pain. Patient not  taking: Reported on 09/20/2016 12/08/13   Bjorn Pippin, MD  predniSONE (STERAPRED UNI-PAK 21 TAB) 10 MG (21) TBPK tablet Take by mouth daily. Dispense steroid taper pack as instructed 09/20/16   Emily Filbert, MD  promethazine (PHENERGAN) 25 MG tablet Take 1 tablet (25 mg total) by mouth every 6 (six) hours as needed for nausea or vomiting. Patient not taking: Reported on 09/20/2016 12/08/13   Bjorn Pippin, MD  sulfamethoxazole-trimethoprim (BACTRIM DS,SEPTRA DS) 800-160 MG tablet Take 1 tablet by mouth 2 (two) times daily. 03/30/17   Joni Reining, PA-C  sulfamethoxazole-trimethoprim (BACTRIM,SEPTRA) 200-40 MG/5ML suspension Take 5 mLs by mouth 2 (two) times daily. 04/26/17   Joni Reining, PA-C  tamsulosin (FLOMAX) 0.4 MG CAPS capsule Take 1 capsule (0.4 mg total) by mouth daily. 03/02/17   Nita Sickle, MD    Allergies Patient has no known allergies.  No family history on file.  Social History Social History   Tobacco Use  . Smoking status: Current Every Day Smoker    Packs/day: 0.50    Types: Cigarettes  . Smokeless tobacco: Never Used  Substance Use Topics  . Alcohol use: No  . Drug use: No    Review of Systems Constitutional: No fever/chills Eyes: No visual changes. ENT: No sore throat. Cardiovascular: Denies chest pain. Respiratory: Denies shortness of breath. Gastrointestinal: No abdominal pain.  No nausea, no vomiting.  No diarrhea.  No constipation. Genitourinary: Negative for dysuria. Musculoskeletal: Negative for back pain. Skin: Edematous papular lesion nonfluctuant right buttocks.  Neurological: Negative for headaches, focal weakness or numbness.   ____________________________________________   PHYSICAL EXAM:  VITAL SIGNS: ED Triage Vitals  Enc Vitals Group     BP 04/26/17 1119 124/88     Pulse Rate 04/26/17 1119 96     Resp 04/26/17 1119 18     Temp 04/26/17 1119 97.9 F (36.6 C)     Temp Source 04/26/17 1119 Oral     SpO2 04/26/17 1119 97  %     Weight 04/26/17 1104 240 lb (108.9 kg)     Height 04/26/17 1104  (1.727 m)     Head Circumference --      Peak Flow --      Pain Score 04/26/17 1103 8     Pain Loc --      Pain Edu? --      Excl. in GC? --     Constitutional: Alert and oriented. Well appearing and in no acute distress. Cardiovascular: Normal rate, regular rhythm. Grossly normal heart sounds.  Good peripheral circulation. Respiratory: Normal respiratory effort.  No retractions. Lungs CTAB. Musculoskeletal: No lower extremity tenderness nor edema.  No joint effusions. Neurologic:  Normal speech and language. No gross focal neurologic deficits are appreciated. No gait instability. Skin: Erythematous papular lesion right buttocks  Psychiatric: Mood and affect are normal. Speech and behavior are normal.  ____________________________________________   LABS (all labs ordered are listed, but only abnormal results are displayed)  Labs Reviewed - No data to display ____________________________________________  EKG   ____________________________________________  RADIOLOGY  No results found.  ____________________________________________   PROCEDURES  Procedure(s) performed: None  Procedures  Critical Care performed: No  ____________________________________________   INITIAL IMPRESSION / ASSESSMENT AND PLAN / ED COURSE  As part of my medical decision making, I reviewed the following data within the electronic MEDICAL RECORD NUMBER Notes from prior ED visits and [place] Controlled Substance Database   Abscess right buttocks. Patient is nonfluctuant not requiring incision and drainage at this time. Patient given discharge Instructions advised take medication as directed. Patient advised to follow with surgical clinic if condition persists.      ____________________________________________   FINAL CLINICAL IMPRESSION(S) / ED DIAGNOSES  Final diagnoses:  Abscess of buttock, right     ED  Discharge Orders        Ordered    sulfamethoxazole-trimethoprim (BACTRIM,SEPTRA) 200-40 MG/5ML suspension  2 times daily     04/26/17 1126    oxyCODONE-acetaminophen (PERCOCET) 7.5-325 MG tablet  Every 6 hours PRN     04/26/17 1126    ibuprofen (ADVIL,MOTRIN) 800 MG tablet  Every 8 hours PRN     04/26/17 1126       Note:  This document was prepared using Dragon voice recognition software and may include unintentional dictation errors.    Joni ReiningSmith, Ronald K, PA-C 04/26/17 1135    Jene EveryKinner, Robert, MD 04/26/17 [phone]

## 2017-04-26 NOTE — Discharge Instructions (Signed)
Follow with surgical clinic for definitive evaluation and treatment

## 2017-04-26 NOTE — ED Notes (Signed)
See triage note  Presents with possible abscess area to buttocks  Was seen for same about 1 month ago.

## 2017-04-26 NOTE — ED Triage Notes (Signed)
Pt c/o abscess to the right buttock for the past 3 days, states he had one in the same area a month ago..Marland Kitchen

## 2017-04-27 ENCOUNTER — Other Ambulatory Visit: Payer: Self-pay

## 2017-04-27 ENCOUNTER — Encounter: Payer: Self-pay | Admitting: Emergency Medicine

## 2017-04-27 ENCOUNTER — Telehealth: Payer: Self-pay | Admitting: General Practice

## 2017-04-27 ENCOUNTER — Emergency Department
Admission: EM | Admit: 2017-04-27 | Discharge: 2017-04-27 | Disposition: A | Discharge status: Home / Self Care | Payer: Self-pay | Attending: Emergency Medicine | Admitting: Emergency Medicine

## 2017-04-27 DIAGNOSIS — Z7982 Long term (current) use of aspirin: Secondary | ICD-10-CM | POA: Insufficient documentation

## 2017-04-27 DIAGNOSIS — L0231 Cutaneous abscess of buttock: Secondary | ICD-10-CM | POA: Insufficient documentation

## 2017-04-27 DIAGNOSIS — F17210 Nicotine dependence, cigarettes, uncomplicated: Secondary | ICD-10-CM | POA: Insufficient documentation

## 2017-04-27 MED ORDER — SULFAMETHOXAZOLE-TRIMETHOPRIM 800-160 MG PO TABS
1.0000 | ORAL_TABLET | Freq: Once | ORAL | Status: AC
  Administered 2017-04-27: 1 via ORAL
  Filled 2017-04-27: qty 1

## 2017-04-27 MED ORDER — OXYCODONE-ACETAMINOPHEN 7.5-325 MG PO TABS: 1.0000 | ORAL_TABLET | Freq: Four times a day (QID) | ORAL | 0 refills | Status: DC | PRN

## 2017-04-27 MED ORDER — SULFAMETHOXAZOLE-TRIMETHOPRIM 800-160 MG PO TABS: 1.0000 | ORAL_TABLET | Freq: Two times a day (BID) | ORAL | 0 refills | Status: DC

## 2017-04-27 NOTE — ED Provider Notes (Signed)
[facility] Emergency Department Provider Note   ____________________________________________   First MD Initiated Contact with Patient 04/27/17 1130     (approximate)  I have reviewed the triage vital signs and the nursing notes.   HISTORY  Chief Complaint Abscess    HPI [name] is a 51 y.o. male patient returns back to the ED secondary to no improvement to the abscess on his right buttocks. Patient was seen in this clinic yesterday mistakenly given the pediatric dosage for Bactrim. Patient state he discussed with the pharmacist was told to follow-up telephone likely with the ED but decided to come in. Patient has scheduled an appointment with the surgical clinic secondary to recurrent pilonidal abscess. Patient appointment is for 05/07/2017. Patient is asking for antibiotics and pain medication to cover him until appointment. Patient rates his pain as a 9/10. Patient described a pain as "achy". Patient denies any drainage from the area.  Past Medical History:  Diagnosis Date  . Hematuria   . History of kidney stones   . Left ureteral calculus   . Nephrolithiasis     There are no active problems to display for this patient.   Past Surgical History:  Procedure Laterality Date  . CYSTO/  URETEROSCOPY LASER LITHOTRIPSY WITH STONE EXTRACTION/  STENT PLACEMENT Right 2014   (raliegh)  . CYSTOSCOPY WITH URETEROSCOPY AND STENT PLACEMENT Left 12/23/2013   Procedure: LEFT URETEROSCOPY WITH STENT EXCHANGE, STONE EXTRACTION WITH BASKET;  Surgeon: Anner CreteJohn J Wrenn, MD;  Location[facility] [place];  Service: Urology;  Laterality: Left;  . CYSTOSCOPY/RETROGRADE/URETEROSCOPY Left 12/08/2013   Procedure: CYSTOSCOPY/LEFT RETROGRADE PYELOGRAM/ INSERTION LEFT URETERAL STENT;  Surgeon: Anner CreteJohn J Wrenn, MD;  Location: [facility];  Service: Urology;  Laterality: Left;    Prior to Admission medications   Medication Sig Start Date End Date Taking? Authorizing Provider    aspirin 81 MG EC tablet Take 1 tablet (81 mg total) by mouth daily. Swallow whole. 09/20/16   Emily FilbertWilliams, Jonathan E, MD  ciprofloxacin (CIPRO) 500 MG tablet Take 1 tablet (500 mg total) by mouth 2 (two) times daily. Patient not taking: Reported on 09/20/2016 12/08/13   Bjorn PippinWrenn, John, MD  HYDROcodone-acetaminophen [facility]) 5-325 MG tablet Take 1 tablet by mouth every 4 (four) hours as needed for moderate pain. DO NOT fill without accompanied acyclovir 02/07/17   Willy Eddyobinson, Patrick, MD  ibuprofen (ADVIL,MOTRIN) 600 MG tablet Take 1 tablet (600 mg total) by mouth every 6 (six) hours as needed. 03/02/17   Nita SickleVeronese, Paisley, MD  ibuprofen (ADVIL,MOTRIN) 800 MG tablet Take 1 tablet (800 mg total) by mouth every 8 (eight) hours as needed for moderate pain. 03/30/17   Joni ReiningSmith, Ronald K, PA-C  ibuprofen (ADVIL,MOTRIN) 800 MG tablet Take 1 tablet (800 mg total) by mouth every 8 (eight) hours as needed. 04/26/17   Joni ReiningSmith, Ronald K, PA-C  Multiple Vitamin (MULTIVITAMIN WITH MINERALS) TABS tablet Take 1 tablet by mouth daily.    [provider]  naproxen (NAPROSYN) 500 MG tablet Take 1 tablet (500 mg total) by mouth 2 (two) times daily with a meal. Patient not taking: Reported on 09/20/2016 01/24/16   Tommi RumpsSummers, Rhonda L, PA-C  naproxen sodium (ANAPROX) 220 MG tablet Take 440 mg by mouth 2 (two) times daily as needed (pain).    [provider]  ondansetron (ZOFRAN) 4 MG tablet Take 1 tablet (4 mg total) by mouth every 8 (eight) hours as needed for nausea or vomiting. 06/26/16   Jeanmarie PlantMcShane, James A, MD  oxyCODONE-acetaminophen (PERCOCET) 7.5-325 MG tablet Take 1 tablet by mouth every 6 (six) hours as needed for severe pain. 04/26/17   Joni Reining, PA-C  oxyCODONE-acetaminophen (PERCOCET) 7.5-325 MG tablet Take 1 tablet by mouth every 6 (six) hours as needed for severe pain. 04/27/17   Joni Reining, PA-C  oxyCODONE-acetaminophen (PERCOCET) 7.5-325 MG tablet Take 1 tablet by mouth every 6 (six) hours as needed  for severe pain. 04/27/17   Joni Reining, PA-C  oxyCODONE-acetaminophen (ROXICET) 5-325 MG tablet Take 1 tablet by mouth every 6 (six) hours as needed. 03/02/17 03/02/18  Nita Sickle, MD  oxyCODONE-acetaminophen (ROXICET) 5-325 MG tablet Take 1 tablet by mouth every 6 (six) hours as needed. 03/30/17 03/30/18  Joni Reining, PA-C  phenazopyridine (PYRIDIUM) 100 MG tablet Take 1 tablet (100 mg total) by mouth 3 (three) times daily as needed for pain. 02/07/17 02/07/18  Willy Eddy, MD  phenazopyridine (PYRIDIUM) 200 MG tablet Take 1 tablet (200 mg total) by mouth 3 (three) times daily as needed for pain. Patient not taking: Reported on 09/20/2016 12/08/13   Bjorn Pippin, MD  predniSONE (STERAPRED UNI-PAK 21 TAB) 10 MG (21) TBPK tablet Take by mouth daily. Dispense steroid taper pack as instructed 09/20/16   Emily Filbert, MD  promethazine (PHENERGAN) 25 MG tablet Take 1 tablet (25 mg total) by mouth every 6 (six) hours as needed for nausea or vomiting. Patient not taking: Reported on 09/20/2016 12/08/13   Bjorn Pippin, MD  sulfamethoxazole-trimethoprim (BACTRIM DS,SEPTRA DS) 800-160 MG tablet Take 1 tablet by mouth 2 (two) times daily. 03/30/17   Joni Reining, PA-C  sulfamethoxazole-trimethoprim (BACTRIM DS,SEPTRA DS) 800-160 MG tablet Take 1 tablet by mouth 2 (two) times daily. 04/27/17   Joni Reining, PA-C  sulfamethoxazole-trimethoprim (BACTRIM DS,SEPTRA DS) 800-160 MG tablet Take 1 tablet by mouth 2 (two) times daily. 04/27/17   Joni Reining, PA-C  sulfamethoxazole-trimethoprim (BACTRIM,SEPTRA) 200-40 MG/5ML suspension Take 5 mLs by mouth 2 (two) times daily. 04/26/17   Joni Reining, PA-C  tamsulosin (FLOMAX) 0.4 MG CAPS capsule Take 1 capsule (0.4 mg total) by mouth daily. 03/02/17   Nita Sickle, MD    Allergies Patient has no known allergies.  History reviewed. No pertinent family history.  Social History Social History   Tobacco Use  . Smoking status:  Current Every Day Smoker    Packs/day: 0.50    Types: Cigarettes  . Smokeless tobacco: Never Used  Substance Use Topics  . Alcohol use: No  . Drug use: No    Review of Systems Constitutional: No fever/chills Eyes: No visual changes. ENT: No sore throat. Cardiovascular: Denies chest pain. Respiratory: Denies shortness of breath. Gastrointestinal: No abdominal pain.  No nausea, no vomiting.  No diarrhea.  No constipation. Genitourinary: Negative for dysuria. Musculoskeletal: Negative for back pain. Skin: Negative for rash. . Redness and swelling to right buttocks  Neurological: Negative for headaches, focal weakness or numbness.   ____________________________________________   PHYSICAL EXAM:  VITAL SIGNS: ED Triage Vitals  Enc Vitals Group     BP 04/27/17 1118 132/83     Pulse Rate 04/27/17 1118 99     Resp 04/27/17 1118 16     Temp 04/27/17 1118 99.1 F (37.3 C)     Temp Source 04/27/17 1118 Oral     SpO2 04/27/17 1118 97 %     Weight 04/27/17 1104 240 lb (108.9 kg)     Height 04/27/17 1119 5\' 8"  (1.727 m)  Head Circumference --      Peak Flow --      Pain Score 04/27/17 1104 9     Pain Loc --      Pain Edu? --      Excl. in GC? --    Constitutional: Alert and oriented. Well appearing and in no acute distress. Cardiovascular: Normal rate, regular rhythm. Grossly normal heart sounds.  Good peripheral circulation. Respiratory: Normal respiratory effort.  No retractions. Lungs CTAB. Skin:  Skin is warm, dry and intact. No rash noted. Erythema and edema of right buttocks. Psychiatric: Mood and affect are normal. Speech and behavior are normal.  ____________________________________________   LABS (all labs ordered are listed, but only abnormal results are displayed)  Labs Reviewed - No data to display ____________________________________________  EKG   ____________________________________________  RADIOLOGY  No results  found.  ____________________________________________   PROCEDURES  Procedure(s) performed: None  Procedures  Critical Care performed: No  ____________________________________________   INITIAL IMPRESSION / ASSESSMENT AND PLAN / ED COURSE  As part of my medical decision making, I reviewed the following data within the electronic MEDICAL RECORD NUMBER    Patient's present for reevaluation of a nonfluctuant abscess to the right buttocks. Patient has history of pilonidal cysts and now has appointment to follow-up surgical department. Patient was advised to given prescription for pediatric Bactrim on his visit yesterday. Patient prescription will be corrected his continue pain medication and follow with surgical clinic. Patient advised to follow-up with his department his area becomes fluctuant or discharge is noticed. Advised continue following discharge care instructions.      ____________________________________________   FINAL CLINICAL IMPRESSION(S) / ED DIAGNOSES  Final diagnoses:  Abscess of buttock, right        Note:  This document was prepared using Dragon voice recognition software and may include unintentional dictation errors.    Joni ReiningSmith, Ronald K, PA-C 04/27/17 1155    Jeanmarie PlantMcShane, James A, MD 04/27/17 [phone]

## 2017-04-27 NOTE — Telephone Encounter (Signed)
Unable to leave message for the patient to call the office, patient needs to schedule  for next week with anyone available for Abscess of buttock, right, please schedule if patient calls back.

## 2017-04-27 NOTE — ED Triage Notes (Signed)
Pt to ed with c/o abscess to right buttock x 4 days.  Seen here yesterday for same.  States area is worse today.

## 2017-04-27 NOTE — Discharge Instructions (Signed)
Follow-up as scheduled surgical clinic appointment on 05/07/2017. Return to ED if redness spread of the circled area walk today.

## 2017-04-27 NOTE — ED Notes (Signed)
See triage note  States area to buttocks is getting larger and is more painful  Was seen yesterday for same but did not have area lanced

## 2017-04-28 ENCOUNTER — Other Ambulatory Visit: Payer: Self-pay

## 2017-04-28 ENCOUNTER — Encounter: Payer: Self-pay | Admitting: Emergency Medicine

## 2017-04-28 ENCOUNTER — Emergency Department
Admission: EM | Admit: 2017-04-28 | Discharge: 2017-04-28 | Disposition: A | Discharge status: Home / Self Care | Payer: Self-pay | Attending: Student in an Organized Health Care Education/Training Program | Admitting: Student in an Organized Health Care Education/Training Program

## 2017-04-28 DIAGNOSIS — Z79899 Other long term (current) drug therapy: Secondary | ICD-10-CM | POA: Insufficient documentation

## 2017-04-28 DIAGNOSIS — F17210 Nicotine dependence, cigarettes, uncomplicated: Secondary | ICD-10-CM | POA: Insufficient documentation

## 2017-04-28 DIAGNOSIS — L0231 Cutaneous abscess of buttock: Secondary | ICD-10-CM | POA: Insufficient documentation

## 2017-04-28 DIAGNOSIS — Z7982 Long term (current) use of aspirin: Secondary | ICD-10-CM | POA: Insufficient documentation

## 2017-04-28 MED ORDER — IBUPROFEN 800 MG PO TABS
800.0000 mg | ORAL_TABLET | Freq: Once | ORAL | Status: AC
  Administered 2017-04-28: 800 mg via ORAL
  Filled 2017-04-28: qty 1

## 2017-04-28 MED ORDER — OXYCODONE-ACETAMINOPHEN 5-325 MG PO TABS
1.0000 | ORAL_TABLET | Freq: Once | ORAL | Status: AC
  Administered 2017-04-28: 1 via ORAL
  Filled 2017-04-28: qty 1

## 2017-04-28 MED ORDER — LIDOCAINE HCL (PF) 1 % IJ SOLN
INTRAMUSCULAR | Status: AC
  Administered 2017-04-28: 5 mL
  Filled 2017-04-28: qty 5

## 2017-04-28 NOTE — Discharge Instructions (Signed)
Patient advised to return back in 2 days for reevaluation and wound recheck.

## 2017-04-28 NOTE — ED Provider Notes (Signed)
[facility] Emergency Department Provider Note   ____________________________________________   First MD Initiated Contact with Patient 04/28/17 1240     (approximate)  I have reviewed the triage vital signs and the nursing notes.   HISTORY  Chief Complaint Abscess    HPI [name] is a 51 y.o. male patient returned for evaluation of abscess to the right buttocks. Patient was seen yesterday Area was not fluctuant for I&D. Patient has given marker placed which revealed that erythema and edema has not exceeded the margins. Patient state pain has increased and is unable to sit down secondary to the pain and pressure to the right buttocks. Patient continue previous medication of antibiotics and narcotic. Patient did not have an appointment to the surgical clinic until 05/07/2017. Patient rates his pain as a 10 over 10. Patient described a pain as sharp and pressure.  Past Medical History:  Diagnosis Date  . Hematuria   . History of kidney stones   . Left ureteral calculus   . Nephrolithiasis     There are no active problems to display for this patient.   Past Surgical History:  Procedure Laterality Date  . CYSTO/  URETEROSCOPY LASER LITHOTRIPSY WITH STONE EXTRACTION/  STENT PLACEMENT Right 2014   (raliegh)  . CYSTOSCOPY WITH URETEROSCOPY AND STENT PLACEMENT Left 12/23/2013   Procedure: LEFT URETEROSCOPY WITH STENT EXCHANGE, STONE EXTRACTION WITH BASKET;  Surgeon: Anner Crete, MD;  Location: [facility];  Service: Urology;  Laterality: Left;  . CYSTOSCOPY/RETROGRADE/URETEROSCOPY Left 12/08/2013   Procedure: CYSTOSCOPY/LEFT RETROGRADE PYELOGRAM/ INSERTION LEFT URETERAL STENT;  Surgeon: Anner Crete, MD;  Location: [facility];  Service: Urology;  Laterality: Left;    Prior to Admission medications   Medication Sig Start Date End Date Taking? Authorizing Provider  aspirin 81 MG EC tablet Take 1 tablet (81 mg total) by mouth daily. Swallow  whole. 09/20/16   Emily Filbert, MD  ciprofloxacin (CIPRO) 500 MG tablet Take 1 tablet (500 mg total) by mouth 2 (two) times daily. Patient not taking: Reported on 09/20/2016 12/08/13   Bjorn Pippin, MD  HYDROcodone-acetaminophen [facility]) 5-325 MG tablet Take 1 tablet by mouth every 4 (four) hours as needed for moderate pain. DO NOT fill without accompanied acyclovir 02/07/17   Willy Eddy, MD  ibuprofen (ADVIL,MOTRIN) 600 MG tablet Take 1 tablet (600 mg total) by mouth every 6 (six) hours as needed. 03/02/17   Nita Sickle, MD  ibuprofen (ADVIL,MOTRIN) 800 MG tablet Take 1 tablet (800 mg total) by mouth every 8 (eight) hours as needed for moderate pain. 03/30/17   Joni Reining, PA-C  ibuprofen (ADVIL,MOTRIN) 800 MG tablet Take 1 tablet (800 mg total) by mouth every 8 (eight) hours as needed. 04/26/17   Joni Reining, PA-C  Multiple Vitamin (MULTIVITAMIN WITH MINERALS) TABS tablet Take 1 tablet by mouth daily.    [provider]  naproxen (NAPROSYN) 500 MG tablet Take 1 tablet (500 mg total) by mouth 2 (two) times daily with a meal. Patient not taking: Reported on 09/20/2016 01/24/16   Tommi Rumps, PA-C  naproxen sodium (ANAPROX) 220 MG tablet Take 440 mg by mouth 2 (two) times daily as needed (pain).    [provider]  ondansetron (ZOFRAN) 4 MG tablet Take 1 tablet (4 mg total) by mouth every 8 (eight) hours as needed for nausea or vomiting. 06/26/16   Jeanmarie Plant, MD  oxyCODONE-acetaminophen (PERCOCET) 7.5-325 MG tablet Take 1 tablet by mouth  every 6 (six) hours as needed for severe pain. 04/26/17   Joni ReiningSmith, Rawley Harju K, PA-C  oxyCODONE-acetaminophen (PERCOCET) 7.5-325 MG tablet Take 1 tablet by mouth every 6 (six) hours as needed for severe pain. 04/27/17   Joni ReiningSmith, Ambera Fedele K, PA-C  oxyCODONE-acetaminophen (PERCOCET) 7.5-325 MG tablet Take 1 tablet by mouth every 6 (six) hours as needed for severe pain. 04/27/17   Joni ReiningSmith, Eivin Mascio K, PA-C  oxyCODONE-acetaminophen  (ROXICET) 5-325 MG tablet Take 1 tablet by mouth every 6 (six) hours as needed. 03/02/17 03/02/18  Nita SickleVeronese, [place], MD  oxyCODONE-acetaminophen (ROXICET) 5-325 MG tablet Take 1 tablet by mouth every 6 (six) hours as needed. 03/30/17 03/30/18  Joni ReiningSmith, Dandrea Widdowson K, PA-C  phenazopyridine (PYRIDIUM) 100 MG tablet Take 1 tablet (100 mg total) by mouth 3 (three) times daily as needed for pain. 02/07/17 02/07/18  Willy Eddyobinson, Patrick, MD  phenazopyridine (PYRIDIUM) 200 MG tablet Take 1 tablet (200 mg total) by mouth 3 (three) times daily as needed for pain. Patient not taking: Reported on 09/20/2016 12/08/13   Bjorn PippinWrenn, John, MD  predniSONE (STERAPRED UNI-PAK 21 TAB) 10 MG (21) TBPK tablet Take by mouth daily. Dispense steroid taper pack as instructed 09/20/16   Emily FilbertWilliams, Jonathan E, MD  promethazine (PHENERGAN) 25 MG tablet Take 1 tablet (25 mg total) by mouth every 6 (six) hours as needed for nausea or vomiting. Patient not taking: Reported on 09/20/2016 12/08/13   Bjorn PippinWrenn, John, MD  sulfamethoxazole-trimethoprim (BACTRIM DS,SEPTRA DS) 800-160 MG tablet Take 1 tablet by mouth 2 (two) times daily. 03/30/17   Joni ReiningSmith, Digby Groeneveld K, PA-C  sulfamethoxazole-trimethoprim (BACTRIM DS,SEPTRA DS) 800-160 MG tablet Take 1 tablet by mouth 2 (two) times daily. 04/27/17   Joni ReiningSmith, Javier Gell K, PA-C  sulfamethoxazole-trimethoprim (BACTRIM DS,SEPTRA DS) 800-160 MG tablet Take 1 tablet by mouth 2 (two) times daily. 04/27/17   Joni ReiningSmith, Clayden Withem K, PA-C  sulfamethoxazole-trimethoprim (BACTRIM,SEPTRA) 200-40 MG/5ML suspension Take 5 mLs by mouth 2 (two) times daily. 04/26/17   Joni ReiningSmith, Briell Paulette K, PA-C  tamsulosin (FLOMAX) 0.4 MG CAPS capsule Take 1 capsule (0.4 mg total) by mouth daily. 03/02/17   Nita SickleVeronese, [place], MD    Allergies Patient has no known allergies.  No family history on file.  Social History Social History   Tobacco Use  . Smoking status: Current Every Day Smoker    Packs/day: 0.50    Types: Cigarettes  . Smokeless tobacco: Never  Used  Substance Use Topics  . Alcohol use: No  . Drug use: No    Review of Systems Constitutional: No fever/chills Eyes: No visual changes. ENT: No sore throat. Cardiovascular: Denies chest pain. Respiratory: Denies shortness of breath. Gastrointestinal: No abdominal pain.  No nausea, no vomiting.  No diarrhea.  No constipation. Genitourinary: Negative for dysuria. Musculoskeletal: Negative for back pain. Skin: Edema and erythema right buttocks Neurological: Negative for headaches, focal weakness or numbness.   ____________________________________________   PHYSICAL EXAM:  VITAL SIGNS: ED Triage Vitals  Enc Vitals Group     BP 04/28/17 1135 132/89     Pulse Rate 04/28/17 1135 91     Resp 04/28/17 1135 (!) 22     Temp 04/28/17 1135 98.4 F (36.9 C)     Temp Source 04/28/17 1135 Oral     SpO2 04/28/17 1135 96 %     Weight --      Height --      Head Circumference --      Peak Flow --      Pain Score 04/28/17 1138 10  Pain Loc --      Pain Edu? --      Excl. in GC? --    Constitutional: Alert and oriented. Well appearing and in no acute distress. Cardiovascular: Normal rate, regular rhythm. Grossly normal heart sounds.  Good peripheral circulation. Respiratory: Normal respiratory effort.  No retractions. Lungs CTAB. Skin: Right buttocks reveals an erythematous area approximately 3 cm which is nonfluctuant at this time. Psychiatric: Mood and affect are normal. Speech and behavior are normal.  ____________________________________________   LABS (all labs ordered are listed, but only abnormal results are displayed)  Labs Reviewed - No data to display ____________________________________________  EKG   ____________________________________________  RADIOLOGY  No results found.  ____________________________________________   PROCEDURES  Procedure(s) performed: None  Procedures  Critical Care performed:  No  ____________________________________________   INITIAL IMPRESSION / ASSESSMENT AND PLAN / ED COURSE  As part of my medical decision making, I reviewed the following data within the electronic MEDICAL RECORD NUMBER    Nonfluctuant abscess right buttocks. Patient is amenable to a trial of incision and drainage. Incision produced no purulent material. Area was packed with iodoform gauze and patient given discharge care instructions. Patient return back in 2 days for reevaluation. Advised continue previous medications. Advised to contact the surgical clinic and asked to be put on the cancellation list for definitive evaluation and treatment.    ____________________________________________   FINAL CLINICAL IMPRESSION(S) / ED DIAGNOSES  Final diagnoses:  Abscess of buttock, right     ED Discharge Orders    None       Note:  This document was prepared using Dragon voice recognition software and may include unintentional dictation errors.    Joni ReiningSmith, Chantil Bari K, PA-C 04/28/17 1316    Willy Eddyobinson, Patrick, MD 04/28/17 [phone]

## 2017-04-28 NOTE — ED Triage Notes (Signed)
Here for abscess R buttock, here for continued treatment.

## 2017-05-03 NOTE — Telephone Encounter (Signed)
Left a message for the patient to call the office to schedule appointment from being seen in the ED. Please schedule if patient calls back.

## 2017-05-17 ENCOUNTER — Encounter: Payer: Self-pay | Admitting: General Practice

## 2017-07-01 ENCOUNTER — Emergency Department
Admission: EM | Admit: 2017-07-01 | Discharge: 2017-07-01 | Disposition: A | Discharge status: Home / Self Care | Payer: Self-pay | Attending: Emergency Medicine | Admitting: Emergency Medicine

## 2017-07-01 ENCOUNTER — Other Ambulatory Visit: Payer: Self-pay

## 2017-07-01 ENCOUNTER — Emergency Department: Payer: Self-pay

## 2017-07-01 ENCOUNTER — Encounter: Payer: Self-pay | Admitting: Emergency Medicine

## 2017-07-01 DIAGNOSIS — Z7982 Long term (current) use of aspirin: Secondary | ICD-10-CM | POA: Insufficient documentation

## 2017-07-01 DIAGNOSIS — R319 Hematuria, unspecified: Secondary | ICD-10-CM | POA: Insufficient documentation

## 2017-07-01 DIAGNOSIS — Z79899 Other long term (current) drug therapy: Secondary | ICD-10-CM | POA: Insufficient documentation

## 2017-07-01 DIAGNOSIS — Z87891 Personal history of nicotine dependence: Secondary | ICD-10-CM | POA: Insufficient documentation

## 2017-07-01 DIAGNOSIS — N390 Urinary tract infection, site not specified: Secondary | ICD-10-CM | POA: Insufficient documentation

## 2017-07-01 DIAGNOSIS — R109 Unspecified abdominal pain: Secondary | ICD-10-CM

## 2017-07-01 DIAGNOSIS — N200 Calculus of kidney: Secondary | ICD-10-CM | POA: Insufficient documentation

## 2017-07-01 LAB — CBC WITH DIFFERENTIAL/PLATELET
Basophils Absolute: 0.1 10*3/uL (ref 0–0.1)
Basophils Relative: 1 %
EOS ABS: 0.2 10*3/uL (ref 0–0.7)
EOS PCT: 1 %
HCT: 49.6 % (ref 40.0–52.0)
Hemoglobin: 17 g/dL (ref 13.0–18.0)
Lymphocytes Relative: 26 %
Lymphs Abs: 3 10*3/uL (ref 1.0–3.6)
MCH: 31.1 pg (ref 26.0–34.0)
MCHC: 34.2 g/dL (ref 32.0–36.0)
MCV: 90.8 fL (ref 80.0–100.0)
MONO ABS: 1.1 10*3/uL — AB (ref 0.2–1.0)
Monocytes Relative: 10 %
Neutro Abs: 7.1 10*3/uL — ABNORMAL HIGH (ref 1.4–6.5)
Neutrophils Relative %: 62 %
PLATELETS: 254 10*3/uL (ref 150–440)
RBC: 5.46 MIL/uL (ref 4.40–5.90)
RDW: 13.6 % (ref 11.5–14.5)
WBC: 11.5 10*3/uL — AB (ref 3.8–10.6)

## 2017-07-01 LAB — URINALYSIS, COMPLETE (UACMP) WITH MICROSCOPIC
Bacteria, UA: NONE SEEN
Bilirubin Urine: NEGATIVE
Glucose, UA: NEGATIVE mg/dL
KETONES UR: NEGATIVE mg/dL
Nitrite: NEGATIVE
PH: 6 (ref 5.0–8.0)
Protein, ur: NEGATIVE mg/dL
Specific Gravity, Urine: 1.017 (ref 1.005–1.030)

## 2017-07-01 LAB — BASIC METABOLIC PANEL
ANION GAP: 10 (ref 5–15)
BUN: 12 mg/dL (ref 6–20)
CO2: 23 mmol/L (ref 22–32)
Calcium: 8.7 mg/dL — ABNORMAL LOW (ref 8.9–10.3)
Chloride: 102 mmol/L (ref 101–111)
Creatinine, Ser: 1.02 mg/dL (ref 0.61–1.24)
GFR calc Af Amer: >60 mL/min (ref >60)
Glucose, Bld: 141 mg/dL — ABNORMAL HIGH (ref 65–99)
POTASSIUM: 3.9 mmol/L (ref 3.5–5.1)
SODIUM: 135 mmol/L (ref 135–145)

## 2017-07-01 IMAGING — US US RENAL
1 series · 14 of 25 positions shown · non-contrast
Comparison: CT, 03/02/2017

CLINICAL DATA: Left flank pain.  History of kidney stones.

EXAM:
RENAL / URINARY TRACT ULTRASOUND COMPLETE

[Series 1: us renal · 0.26mm/px · 14 of 30 slices shown]
[im 1/30]
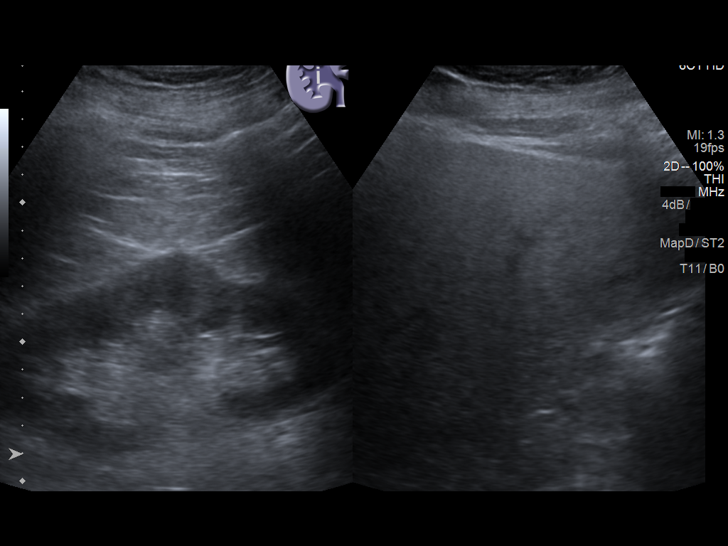
[im 3/30]
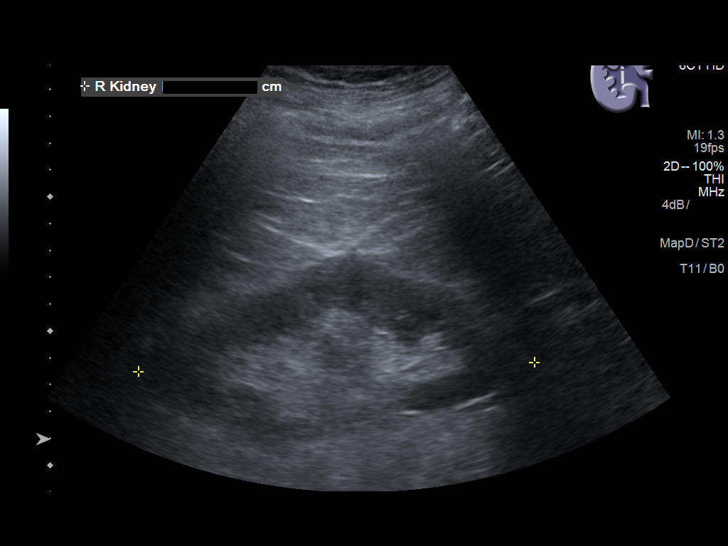
[im 5/30]
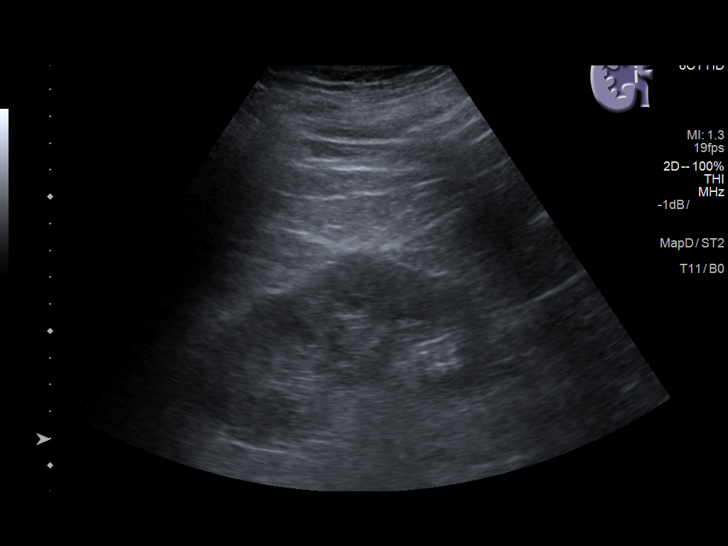
[im 8/30]
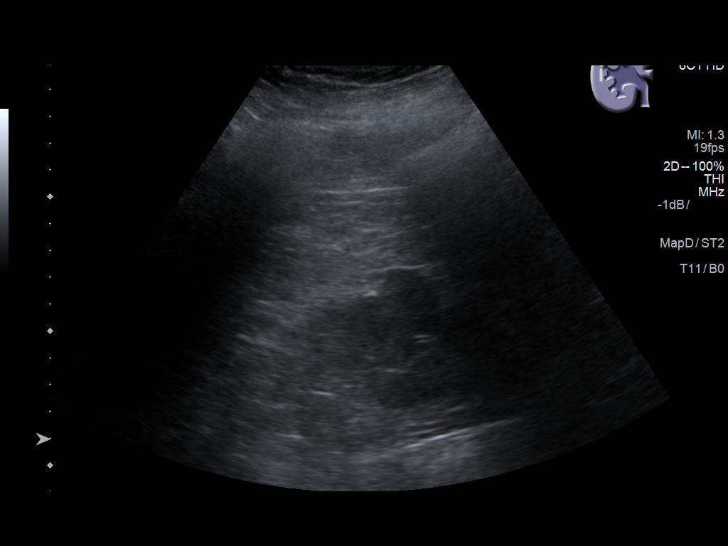
[im 10/30]
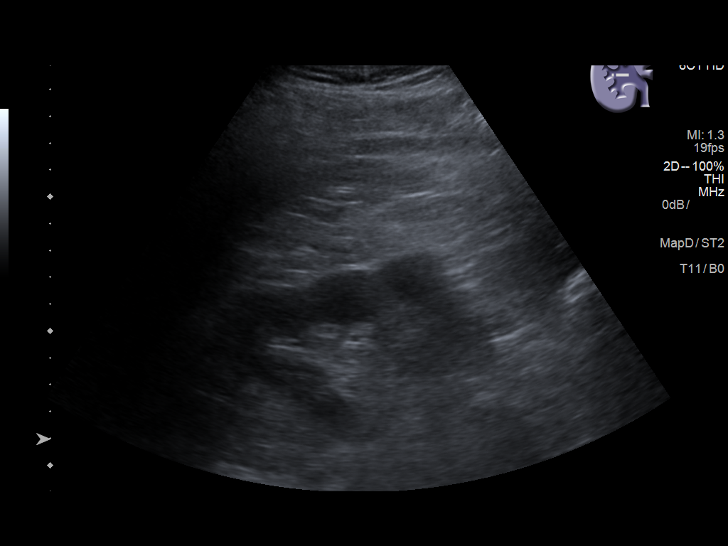
[im 11/30]
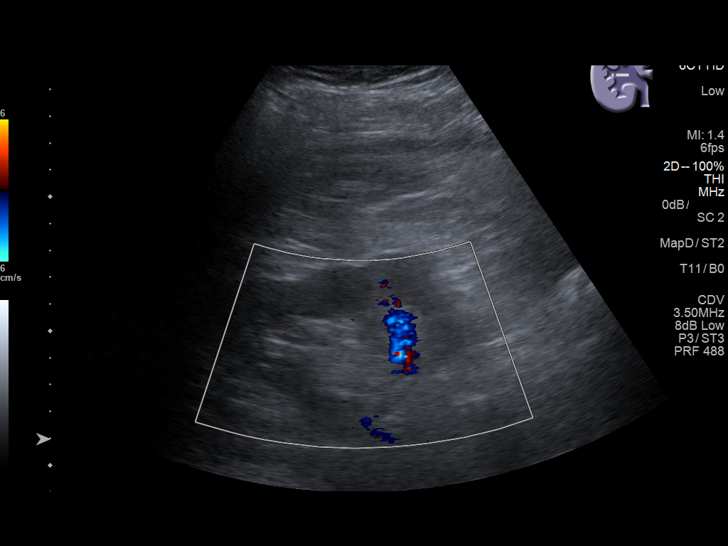
[im 14/30]
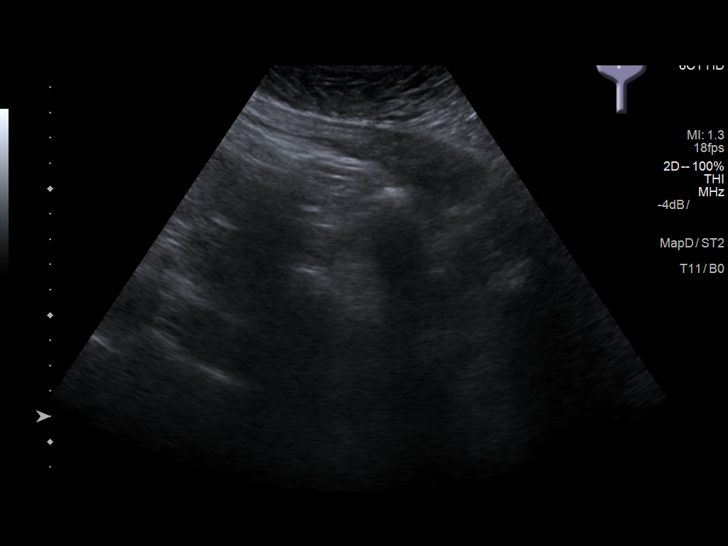
[im 16/30]
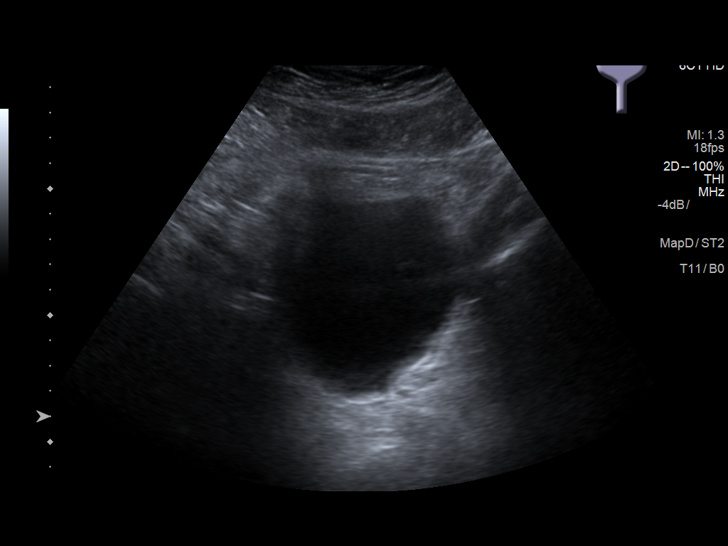
[im 19/30]
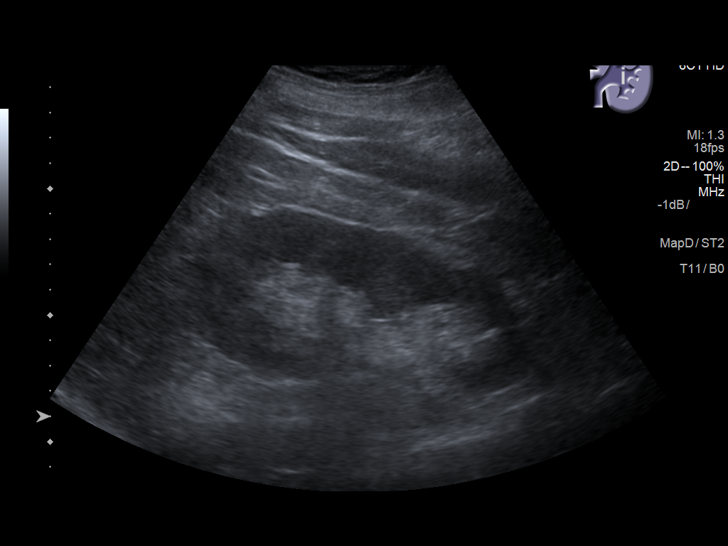
[im 20/30]
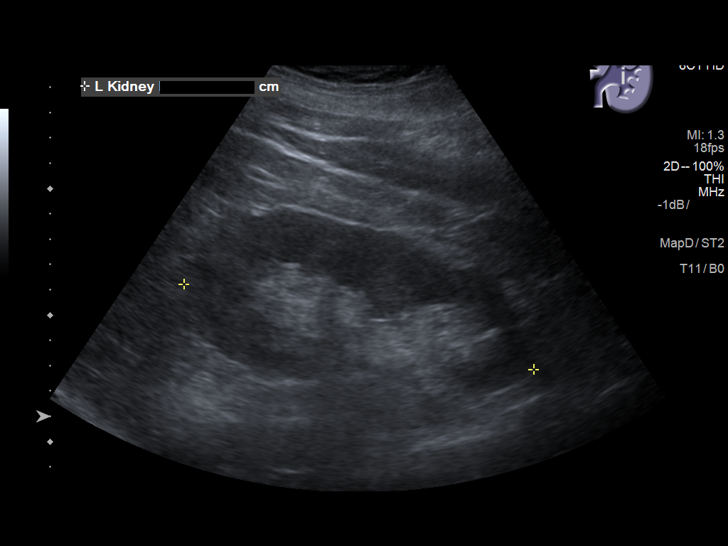
[im 22/30]
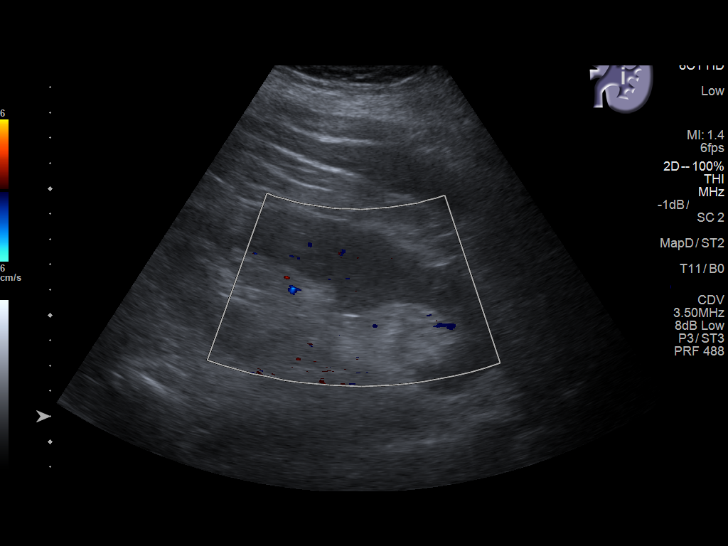
[im 25/30]
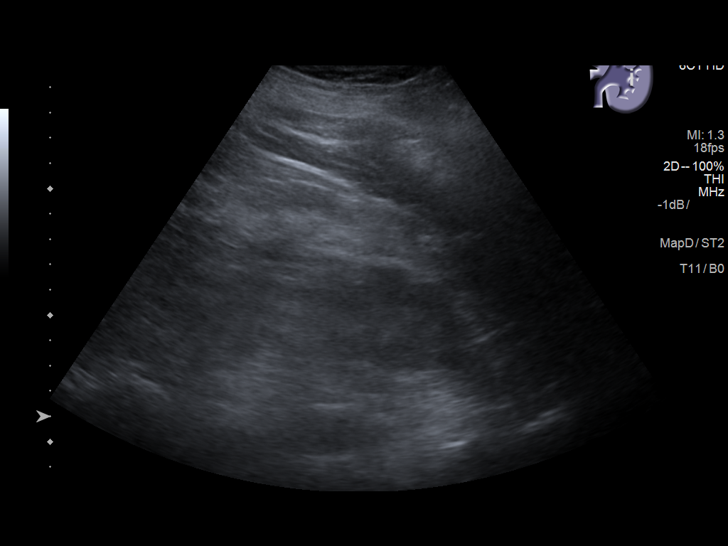
[im 27/30]
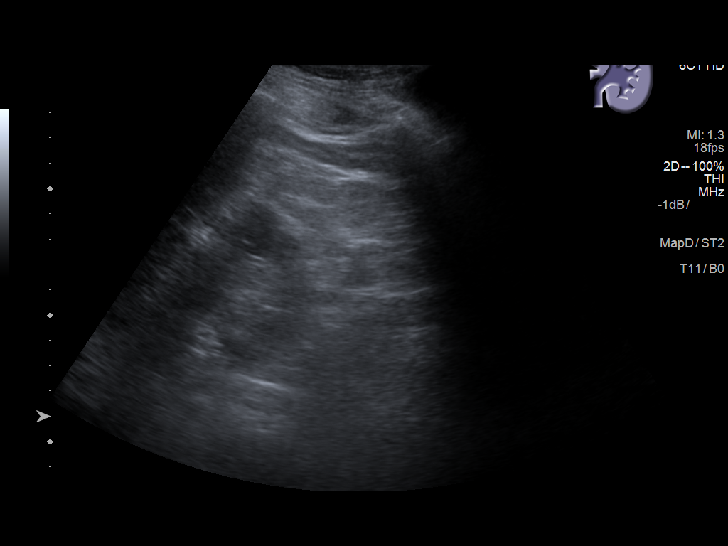
[im 30/30]
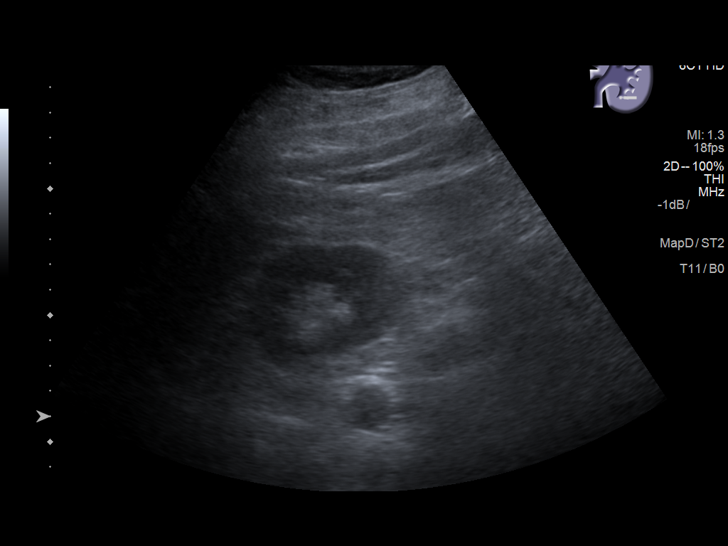

[14 of 25 positions shown; findings below may reference images not displayed]

FINDINGS: Right Kidney:

Length: 14.7 cm. Echogenicity within normal limits. No mass or
hydronephrosis visualized.

Left Kidney:

Length: 14.2 cm. Echogenicity within normal limits. No mass or
hydronephrosis visualized.

Bladder:

There is
IMPRESSION: Normal renal ultrasound. No hydronephrosis to suggest a ureteral
stone.

## 2017-07-01 MED ORDER — MORPHINE SULFATE (PF) 4 MG/ML IV SOLN
4.0000 mg | Freq: Once | INTRAVENOUS | Status: AC
  Administered 2017-07-01: 4 mg via INTRAVENOUS
  Filled 2017-07-01: qty 1

## 2017-07-01 MED ORDER — OXYCODONE-ACETAMINOPHEN 2.5-325 MG PO TABS: 1.0000 | ORAL_TABLET | ORAL | 0 refills | Status: DC | PRN

## 2017-07-01 MED ORDER — TAMSULOSIN HCL 0.4 MG PO CAPS: 0.4000 mg | ORAL_CAPSULE | Freq: Every day | ORAL | 0 refills | Status: DC

## 2017-07-01 MED ORDER — CEPHALEXIN 500 MG PO CAPS
500.0000 mg | ORAL_CAPSULE | Freq: Once | ORAL | Status: AC
  Administered 2017-07-01: 500 mg via ORAL
  Filled 2017-07-01: qty 1

## 2017-07-01 MED ORDER — CEPHALEXIN 500 MG PO CAPS: 500.0000 mg | ORAL_CAPSULE | Freq: Three times a day (TID) | ORAL | 0 refills | Status: AC

## 2017-07-01 MED ORDER — ONDANSETRON HCL 4 MG/2ML IJ SOLN
4.0000 mg | Freq: Once | INTRAMUSCULAR | Status: AC
  Administered 2017-07-01: 4 mg via INTRAVENOUS
  Filled 2017-07-01: qty 2

## 2017-07-01 MED ORDER — KETOROLAC TROMETHAMINE 30 MG/ML IJ SOLN
30.0000 mg | Freq: Once | INTRAMUSCULAR | Status: AC
  Administered 2017-07-01: 30 mg via INTRAVENOUS
  Filled 2017-07-01: qty 1

## 2017-07-01 MED ORDER — SODIUM CHLORIDE 0.9 % IV BOLUS (SEPSIS)
1000.0000 mL | Freq: Once | INTRAVENOUS | Status: AC
  Administered 2017-07-01: 1000 mL via INTRAVENOUS

## 2017-07-01 NOTE — ED Notes (Signed)
PT ride is here to pick up pt. Pt verbalizes d/c understanding of rx and follow up. Pt in NAD , VSS at time of departure

## 2017-07-01 NOTE — ED Provider Notes (Signed)
[facility] Emergency Department Provider Note  ____________________________________________   First MD Initiated Contact with Patient 07/01/17 [phone]     (approximate)  I have reviewed the triage vital signs and the nursing notes.   HISTORY  Chief Complaint Abdominal Pain   HPI [name] is a 52 y.o. male with a history of multiple kidney stones was presenting to the emergency department today with left flank pain radiating around to his lower left abdomen.  He says that he has had kidney stones 8 times and this feels similarly.  He says that the pain started suddenly at 1 AM last night and is a sharp, 10 out of 10 pain.  He denies any fever, nausea, vomiting or diarrhea.  However, he does state that he is having difficulty urinating.  Past Medical History:  Diagnosis Date  . Hematuria   . History of kidney stones   . Left ureteral calculus   . Nephrolithiasis     There are no active problems to display for this patient.   Past Surgical History:  Procedure Laterality Date  . CYSTO/  URETEROSCOPY LASER LITHOTRIPSY WITH STONE EXTRACTION/  STENT PLACEMENT Right 2014   (raliegh)  . CYSTOSCOPY WITH URETEROSCOPY AND STENT PLACEMENT Left 12/23/2013   Procedure: LEFT URETEROSCOPY WITH STENT EXCHANGE, STONE EXTRACTION WITH BASKET;  Surgeon: Anner Crete, MD;  Location: [facility];  Service: Urology;  Laterality: Left;  . CYSTOSCOPY/RETROGRADE/URETEROSCOPY Left 12/08/2013   Procedure: CYSTOSCOPY/LEFT RETROGRADE PYELOGRAM/ INSERTION LEFT URETERAL STENT;  Surgeon: Anner Crete, MD;  Location: [facility];  Service: Urology;  Laterality: Left;    Prior to Admission medications   Medication Sig Start Date End Date Taking? Authorizing Provider  aspirin 81 MG EC tablet Take 1 tablet (81 mg total) by mouth daily. Swallow whole. 09/20/16   Emily Filbert, MD  ciprofloxacin (CIPRO) 500 MG tablet Take 1 tablet (500 mg total) by mouth 2 (two) times  daily. Patient not taking: Reported on 09/20/2016 12/08/13   Bjorn Pippin, MD  HYDROcodone-acetaminophen [facility]) 5-325 MG tablet Take 1 tablet by mouth every 4 (four) hours as needed for moderate pain. DO NOT fill without accompanied acyclovir 02/07/17   Willy Eddy, MD  ibuprofen (ADVIL,MOTRIN) 600 MG tablet Take 1 tablet (600 mg total) by mouth every 6 (six) hours as needed. 03/02/17   Nita Sickle, MD  ibuprofen (ADVIL,MOTRIN) 800 MG tablet Take 1 tablet (800 mg total) by mouth every 8 (eight) hours as needed for moderate pain. 03/30/17   Joni Reining, PA-C  ibuprofen (ADVIL,MOTRIN) 800 MG tablet Take 1 tablet (800 mg total) by mouth every 8 (eight) hours as needed. 04/26/17   Joni Reining, PA-C  Multiple Vitamin (MULTIVITAMIN WITH MINERALS) TABS tablet Take 1 tablet by mouth daily.    [provider]  naproxen (NAPROSYN) 500 MG tablet Take 1 tablet (500 mg total) by mouth 2 (two) times daily with a meal. Patient not taking: Reported on 09/20/2016 01/24/16   Tommi Rumps, PA-C  naproxen sodium (ANAPROX) 220 MG tablet Take 440 mg by mouth 2 (two) times daily as needed (pain).    [provider]  ondansetron (ZOFRAN) 4 MG tablet Take 1 tablet (4 mg total) by mouth every 8 (eight) hours as needed for nausea or vomiting. 06/26/16   Jeanmarie Plant, MD  oxyCODONE-acetaminophen (PERCOCET) 7.5-325 MG tablet Take 1 tablet by mouth every 6 (six) hours as needed for severe pain. 04/26/17   Katrinka Blazing,  Arther Abbottonald K, PA-C  oxyCODONE-acetaminophen (PERCOCET) 7.5-325 MG tablet Take 1 tablet by mouth every 6 (six) hours as needed for severe pain. 04/27/17   Joni ReiningSmith, Ronald K, PA-C  oxyCODONE-acetaminophen (PERCOCET) 7.5-325 MG tablet Take 1 tablet by mouth every 6 (six) hours as needed for severe pain. 04/27/17   Joni ReiningSmith, Ronald K, PA-C  oxyCODONE-acetaminophen (ROXICET) 5-325 MG tablet Take 1 tablet by mouth every 6 (six) hours as needed. 03/02/17 03/02/18  Nita SickleVeronese, [place], MD    oxyCODONE-acetaminophen (ROXICET) 5-325 MG tablet Take 1 tablet by mouth every 6 (six) hours as needed. 03/30/17 03/30/18  Joni ReiningSmith, Ronald K, PA-C  phenazopyridine (PYRIDIUM) 100 MG tablet Take 1 tablet (100 mg total) by mouth 3 (three) times daily as needed for pain. 02/07/17 02/07/18  Willy Eddyobinson, Patrick, MD  phenazopyridine (PYRIDIUM) 200 MG tablet Take 1 tablet (200 mg total) by mouth 3 (three) times daily as needed for pain. Patient not taking: Reported on 09/20/2016 12/08/13   Bjorn PippinWrenn, John, MD  predniSONE (STERAPRED UNI-PAK 21 TAB) 10 MG (21) TBPK tablet Take by mouth daily. Dispense steroid taper pack as instructed 09/20/16   Emily FilbertWilliams, Jonathan E, MD  promethazine (PHENERGAN) 25 MG tablet Take 1 tablet (25 mg total) by mouth every 6 (six) hours as needed for nausea or vomiting. Patient not taking: Reported on 09/20/2016 12/08/13   Bjorn PippinWrenn, John, MD  sulfamethoxazole-trimethoprim (BACTRIM DS,SEPTRA DS) 800-160 MG tablet Take 1 tablet by mouth 2 (two) times daily. 03/30/17   Joni ReiningSmith, Ronald K, PA-C  sulfamethoxazole-trimethoprim (BACTRIM DS,SEPTRA DS) 800-160 MG tablet Take 1 tablet by mouth 2 (two) times daily. 04/27/17   Joni ReiningSmith, Ronald K, PA-C  sulfamethoxazole-trimethoprim (BACTRIM DS,SEPTRA DS) 800-160 MG tablet Take 1 tablet by mouth 2 (two) times daily. 04/27/17   Joni ReiningSmith, Ronald K, PA-C  sulfamethoxazole-trimethoprim (BACTRIM,SEPTRA) 200-40 MG/5ML suspension Take 5 mLs by mouth 2 (two) times daily. 04/26/17   Joni ReiningSmith, Ronald K, PA-C  tamsulosin (FLOMAX) 0.4 MG CAPS capsule Take 1 capsule (0.4 mg total) by mouth daily. 03/02/17   Nita SickleVeronese, [place], MD    Allergies Patient has no known allergies.  No family history on file.  Social History Social History   Tobacco Use  . Smoking status: Former Smoker    Packs/day: 0.50    Types: Cigarettes  . Smokeless tobacco: Never Used  Substance Use Topics  . Alcohol use: No  . Drug use: No    Review of Systems  Constitutional: No fever/chills Eyes:  No visual changes. ENT: No sore throat. Cardiovascular: Denies chest pain. Respiratory: Denies shortness of breath. Gastrointestinal:   No nausea, no vomiting.  No diarrhea.  No constipation. Genitourinary: Negative for dysuria. Musculoskeletal: as above Skin: Negative for rash. Neurological: Negative for headaches, focal weakness or numbness.   ____________________________________________   PHYSICAL EXAM:  VITAL SIGNS: ED Triage Vitals [07/01/17 0735]  Enc Vitals Group     BP (!) 127/93     Pulse Rate (!) 109     Resp 20     Temp 98.5 F (36.9 C)     Temp Source Oral     SpO2 99 %     Weight 250 lb (113.4 kg)     Height 5\' 8"  (1.727 m)     Head Circumference      Peak Flow      Pain Score 10     Pain Loc      Pain Edu?      Excl. in GC?     Constitutional: Alert and oriented. Well  appearing and in no acute distress. Eyes: Conjunctivae are normal.  Head: Atraumatic. Nose: No congestion/rhinnorhea. Mouth/Throat: Mucous membranes are moist.  Neck: No stridor.   Cardiovascular: Normal rate, regular rhythm. Grossly normal heart sounds.   Respiratory: Normal respiratory effort.  No retractions. Lungs CTAB. Gastrointestinal: Soft with mild left lower quadrant abdominal tenderness to palpation as well as mild left CVA tenderness to palpation. No distention.  Musculoskeletal: No lower extremity tenderness nor edema.  No joint effusions. Neurologic:  Normal speech and language. No gross focal neurologic deficits are appreciated. Skin:  Skin is warm, dry and intact. No rash noted. Psychiatric: Mood and affect are normal. Speech and behavior are normal.  ____________________________________________   LABS (all labs ordered are listed, but only abnormal results are displayed)  Labs Reviewed - No data to display ____________________________________________  EKG   ____________________________________________  RADIOLOGY  No acute finding on the renal  ultrasound ____________________________________________   PROCEDURES  Procedure(s) performed:   Procedures  Critical Care performed:   ____________________________________________   INITIAL IMPRESSION / ASSESSMENT AND PLAN / ED COURSE  Pertinent labs & imaging results that were available during my care of the patient were reviewed by me and considered in my medical decision making (see chart for details).  Differential diagnosis includes, but is not limited to, acute appendicitis, renal colic, testicular torsion, urinary tract infection/pyelonephritis, prostatitis,  epididymitis, diverticulitis, small bowel obstruction or ileus, colitis, abdominal aortic aneurysm, gastroenteritis, hernia, etc. As part of my medical decision making, I reviewed the following data within the electronic MEDICAL RECORD NUMBER Notes from prior ED visits  ----------------------------------------- 9:27 AM on 07/01/2017 -----------------------------------------  Patient at this time with heart rate of 95.  Pain relieved with morphine.  Patient without any distress.  Urine appears infected.  Culture sent.  We will treat the patient with Keflex, first dose here.  Discussed case with Dr. Lonna Cobb of urology.  He agrees that the patient should be appropriate for outpatient care given his nontoxic appearance.  I discussed strict return precautions with the patient including to come back for any worsening or concerning symptoms but especially any worsening abdominal or flank pain, fever or chills.  Furthermore, he does not suspect STD and has been tested multiple times over the past 12 months for gonorrhea and chlamydia and they have both been negative.  We discussed that he needs to follow-up with urology for further workup as he has repetitive urinary complaints but only an ER workup.  He is understanding of the diagnosis and treatment plan and willing to comply.  Will be discharged at this time.  Flank pain consistent with  kidney stone pain but possible pyelonephritis given some only small amount of blood in the urine and multiple white blood cells.      ____________________________________________   FINAL CLINICAL IMPRESSION(S) / ED DIAGNOSES  UTI.  Flank pain.      NEW MEDICATIONS STARTED DURING THIS VISIT:  New Prescriptions   No medications on file     Note:  This document was prepared using Dragon voice recognition software and may include unintentional dictation errors.     Myrna Blazer, MD 07/01/17 [phone]

## 2017-07-01 NOTE — ED Triage Notes (Signed)
Pt with left flank pain that started this am. Pt states that he is having trouble passing water. Pt with hx kidney stones and says this feels the same.

## 2017-07-01 NOTE — ED Notes (Signed)
FIRST NURSE NOTE:  Pt arrived via POV with reports of left side flank pain radiating to left lower abdomen. States pain is similar to previous kidney stone pain, states he gets a kidney stone every few months. Pt states sxs started around 0200. States he is not able to fully empty bladder.  Pt ambulatory in triage.

## 2017-07-02 LAB — URINE CULTURE: CULTURE: NO GROWTH

## 2017-07-13 ENCOUNTER — Telehealth: Payer: Self-pay | Admitting: Urology

## 2017-07-13 NOTE — Telephone Encounter (Signed)
Just received this message in the in box today 07-13-17 called patient to see if he wanted to schedule.  Made app and mailed  Marcelino DusterMichelle

## 2017-07-23 ENCOUNTER — Ambulatory Visit: Payer: Self-pay | Admitting: Urology

## 2017-07-23 NOTE — Progress Notes (Deleted)
07/23/2017 2:44 AM   [name] [birth year] [id number]  Referring provider: No referring provider defined for this encounter.  No chief complaint on file.   HPI: Patient is a 52 year old Caucasian male who is referred by [facility] ED for flank pain.  He presented to the ED on 07/01/2017 for the complaint of left flank pain radiating around to this left lower quadrant.  WBC count was 11.5.  Serum creatinine was 1.02.  UA was positive for 6-30 RBC's and TNTC WBC's.  His urine culture was negative.  RUS performed on 07/01/2017 was negative.  He underwent a left URS for a left ureteral stone in 2015.     PMH: Past Medical History:  Diagnosis Date  . Hematuria   . History of kidney stones   . Left ureteral calculus   . Nephrolithiasis     Surgical History: Past Surgical History:  Procedure Laterality Date  . CYSTO/  URETEROSCOPY LASER LITHOTRIPSY WITH STONE EXTRACTION/  STENT PLACEMENT Right 2014   (raliegh)  . CYSTOSCOPY WITH URETEROSCOPY AND STENT PLACEMENT Left 12/23/2013   Procedure: LEFT URETEROSCOPY WITH STENT EXCHANGE, STONE EXTRACTION WITH BASKET;  Surgeon: Anner Crete, MD;  Location: [facility];  Service: Urology;  Laterality: Left;  . CYSTOSCOPY/RETROGRADE/URETEROSCOPY Left 12/08/2013   Procedure: CYSTOSCOPY/LEFT RETROGRADE PYELOGRAM/ INSERTION LEFT URETERAL STENT;  Surgeon: Anner Crete, MD;  Location: [facility];  Service: Urology;  Laterality: Left;    Home Medications:  Allergies as of 07/23/2017   No Known Allergies     Medication List        Accurate as of 07/23/17  2:44 AM. Always use your most recent med list.          aspirin 81 MG EC tablet Take 1 tablet (81 mg total) by mouth daily. Swallow whole.   ciprofloxacin 500 MG tablet Commonly known as:  CIPRO Take 1 tablet (500 mg total) by mouth 2 (two) times daily.   HYDROcodone-acetaminophen 5-325 MG tablet Commonly known as:  NORCO Take 1 tablet by mouth every 4 (four) hours as  needed for moderate pain. DO NOT fill without accompanied acyclovir   ibuprofen 600 MG tablet Commonly known as:  ADVIL,MOTRIN Take 1 tablet (600 mg total) by mouth every 6 (six) hours as needed.   ibuprofen 800 MG tablet Commonly known as:  ADVIL,MOTRIN Take 1 tablet (800 mg total) by mouth every 8 (eight) hours as needed for moderate pain.   ibuprofen 800 MG tablet Commonly known as:  ADVIL,MOTRIN Take 1 tablet (800 mg total) by mouth every 8 (eight) hours as needed.   multivitamin with minerals Tabs tablet Take 1 tablet by mouth daily.   naproxen 500 MG tablet Commonly known as:  NAPROSYN Take 1 tablet (500 mg total) by mouth 2 (two) times daily with a meal.   naproxen sodium 220 MG tablet Commonly known as:  ALEVE Take 440 mg by mouth 2 (two) times daily as needed (pain).   ondansetron 4 MG tablet Commonly known as:  ZOFRAN Take 1 tablet (4 mg total) by mouth every 8 (eight) hours as needed for nausea or vomiting.   oxycodone-acetaminophen 2.5-325 MG tablet Commonly known as:  PERCOCET Take 1 tablet by mouth every 4 (four) hours as needed for pain.   phenazopyridine 200 MG tablet Commonly known as:  PYRIDIUM Take 1 tablet (200 mg total) by mouth 3 (three) times daily as needed for pain.   phenazopyridine 100 MG tablet Commonly known  as:  PYRIDIUM Take 1 tablet (100 mg total) by mouth 3 (three) times daily as needed for pain.   predniSONE 10 MG (21) Tbpk tablet Commonly known as:  STERAPRED UNI-PAK 21 TAB Take by mouth daily. Dispense steroid taper pack as instructed   promethazine 25 MG tablet Commonly known as:  PHENERGAN Take 1 tablet (25 mg total) by mouth every 6 (six) hours as needed for nausea or vomiting.   sulfamethoxazole-trimethoprim 800-160 MG tablet Commonly known as:  BACTRIM DS,SEPTRA DS Take 1 tablet by mouth 2 (two) times daily.   sulfamethoxazole-trimethoprim 200-40 MG/5ML suspension Commonly known as:  BACTRIM,SEPTRA Take 5 mLs by mouth 2  (two) times daily.   sulfamethoxazole-trimethoprim 800-160 MG tablet Commonly known as:  BACTRIM DS,SEPTRA DS Take 1 tablet by mouth 2 (two) times daily.   sulfamethoxazole-trimethoprim 800-160 MG tablet Commonly known as:  BACTRIM DS,SEPTRA DS Take 1 tablet by mouth 2 (two) times daily.   tamsulosin 0.4 MG Caps capsule Commonly known as:  FLOMAX Take 1 capsule (0.4 mg total) by mouth daily.       Allergies: No Known Allergies  Family History: No family history on file.  Social History:  reports that he has quit smoking. His smoking use included cigarettes. He smoked 0.50 packs per day. He has never used smokeless tobacco. He reports that he does not drink alcohol or use drugs.  ROS:                                        Physical Exam: There were no vitals taken for this visit.  Constitutional: Well nourished. Alert and oriented, No acute distress. HEENT: [place] AT, moist mucus membranes. Trachea midline, no masses. Cardiovascular: No clubbing, cyanosis, or edema. Respiratory: Normal respiratory effort, no increased work of breathing. GI: Abdomen is soft, non tender, non distended, no abdominal masses. Liver and spleen not palpable.  No hernias appreciated.  Stool sample for occult testing is not indicated.   GU: No CVA tenderness.  No bladder fullness or masses.  Patient with circumcised/uncircumcised phallus. ***Foreskin easily retracted***  Urethral meatus is patent.  No penile discharge. No penile lesions or rashes. Scrotum without lesions, cysts, rashes and/or edema.  Testicles are located scrotally bilaterally. No masses are appreciated in the testicles. Left and right epididymis are normal. Rectal: Patient with  normal sphincter tone. Anus and perineum without scarring or rashes. No rectal masses are appreciated. Prostate is approximately *** grams, *** nodules are appreciated. Seminal vesicles are normal. Skin: No rashes, bruises or suspicious  lesions. Lymph: No cervical or inguinal adenopathy. Neurologic: Grossly intact, no focal deficits, moving all 4 extremities. Psychiatric: Normal mood and affect.  Laboratory Data: Lab Results  Component Value Date   WBC 11.5 (H) 07/01/2017   HGB 17.0 07/01/2017   HCT 49.6 07/01/2017   MCV 90.8 07/01/2017   PLT 254 07/01/2017    Lab Results  Component Value Date   CREATININE 1.02 07/01/2017    No results found for: PSA  No results found for: TESTOSTERONE  No results found for: HGBA1C  No results found for: TSH  No results found for: CHOL, HDL, CHOLHDL, VLDL, LDLCALC  Lab Results  Component Value Date   AST 25 03/02/2017   Lab Results  Component Value Date   ALT 21 03/02/2017   No components found for: ALKALINEPHOPHATASE No components found for: BILIRUBINTOTAL  No results found  for: ESTRADIOL  Urinalysis    Component Value Date/Time   COLORURINE YELLOW (A) 07/01/2017 0750   APPEARANCEUR HAZY (A) 07/01/2017 0750   APPEARANCEUR Hazy 12/08/2013 0950   LABSPEC 1.017 07/01/2017 0750   LABSPEC 1.030 12/08/2013 0950   PHURINE 6.0 07/01/2017 0750   GLUCOSEU NEGATIVE 07/01/2017 0750   GLUCOSEU Negative 12/08/2013 0950   HGBUR SMALL (A) 07/01/2017 0750   BILIRUBINUR NEGATIVE 07/01/2017 0750   BILIRUBINUR Negative 12/08/2013 0950   KETONESUR NEGATIVE 07/01/2017 0750   PROTEINUR NEGATIVE 07/01/2017 0750   NITRITE NEGATIVE 07/01/2017 0750   LEUKOCYTESUR MODERATE (A) 07/01/2017 0750   LEUKOCYTESUR 2+ 12/08/2013 0950    I have reviewed the labs.   Pertinent Imaging: CLINICAL DATA:  Left flank pain.  History of kidney stones.  EXAM: RENAL / URINARY TRACT ULTRASOUND COMPLETE  COMPARISON:  CT, 03/02/2017  FINDINGS: Right Kidney:  Length: 14.7 cm. Echogenicity within normal limits. No mass or hydronephrosis visualized.  Left Kidney:  Length: 14.2 cm. Echogenicity within normal limits. No mass or hydronephrosis visualized.  Bladder:  There  is  IMPRESSION: Normal renal ultrasound. No hydronephrosis to suggest a ureteral stone.   Electronically Signed   By: Amie Portlandavid  Ormond M.D.   On: 07/01/2017 08:46 I have independently reviewed the films.    Assessment & Plan:  ***  1. Microscopic hematuria  - I explained to the patient that there are a number of causes that can be associated with blood in the urine, such as stones, BPH, UTI's, damage to the urinary tract and/or cancer.  - At this time, I felt that the patient warranted further urologic evaluation.   The AUA guidelines state that a CT urogram is the preferred imaging study to evaluate hematuria.  - I explained to the patient that a contrast material will be injected into a vein and that in rare instances, an allergic reaction can result and may even life threatening   The patient denies any allergies to contrast***, iodine and/or seafood*** and is not taking metformin.***  - Her reproductive status is hysterectomy, postmenopausal, tubal ligation are unknown at this time.  We will obtain a serum pregnancy test today. ***  - On occasion, we may need to resort to a non-contrast study such as a renal ultrasound or a non-contrast CT if the patient has a contrast allergy or renal insufficiency.  In the latter case,  I told him/her *** that an upper tract study without contrast will lack the detail of excluding some urologic tumors.  Because of this, he/she would need to undergo cystoscopy with bilateral retrogrades in the OR to complete the hematuria workup in addition to the imaging studies. ***  - Following the imaging study,  I've recommended a cystoscopy. I described how this is performed, typically in an office setting with a flexible cystoscope. We described the risks, benefits, and possible side effects, the most common of which is a minor amount of blood in the urine and/or burning which usually resolves in 24 to 48 hours.  ***  - The patient had the opportunity to ask  questions which were answered. Based upon this discussion, the patient is willing to proceed. Therefore, I've ordered: a CT Urogram and cystoscopy.  - The patient will return following all of the above for discussion of the results.   - UA  - Urine culture  - BUN + creatinine    No follow-ups on file.  These notes generated with voice recognition software. I apologize for typographical errors.  Zara Council, [facility] [address] [place], [place] [address] [phone]

## 2017-07-24 ENCOUNTER — Encounter: Payer: Self-pay | Admitting: Urology

## 2017-08-09 ENCOUNTER — Emergency Department
Admission: EM | Admit: 2017-08-09 | Discharge: 2017-08-09 | Disposition: A | Discharge status: Home / Self Care | Payer: Self-pay | Attending: Emergency Medicine | Admitting: Emergency Medicine

## 2017-08-09 ENCOUNTER — Other Ambulatory Visit: Payer: Self-pay

## 2017-08-09 ENCOUNTER — Encounter: Payer: Self-pay | Admitting: Emergency Medicine

## 2017-08-09 DIAGNOSIS — L0231 Cutaneous abscess of buttock: Secondary | ICD-10-CM | POA: Insufficient documentation

## 2017-08-09 DIAGNOSIS — Z7982 Long term (current) use of aspirin: Secondary | ICD-10-CM | POA: Insufficient documentation

## 2017-08-09 DIAGNOSIS — Z87891 Personal history of nicotine dependence: Secondary | ICD-10-CM | POA: Insufficient documentation

## 2017-08-09 MED ORDER — OXYCODONE-ACETAMINOPHEN 2.5-325 MG PO TABS: 1.0000 | ORAL_TABLET | Freq: Three times a day (TID) | ORAL | 0 refills | Status: DC | PRN

## 2017-08-09 MED ORDER — SULFAMETHOXAZOLE-TRIMETHOPRIM 800-160 MG PO TABS
1.0000 | ORAL_TABLET | Freq: Once | ORAL | Status: AC
  Administered 2017-08-09: 1 via ORAL
  Filled 2017-08-09: qty 1

## 2017-08-09 MED ORDER — LIDOCAINE HCL (PF) 1 % IJ SOLN
5.0000 mL | Freq: Once | INTRAMUSCULAR | Status: AC
  Administered 2017-08-09: 5 mL
  Filled 2017-08-09: qty 5

## 2017-08-09 MED ORDER — OXYCODONE-ACETAMINOPHEN 5-325 MG PO TABS: 1.0000 | ORAL_TABLET | Freq: Three times a day (TID) | ORAL | 0 refills | Status: AC | PRN

## 2017-08-09 MED ORDER — SULFAMETHOXAZOLE-TRIMETHOPRIM 800-160 MG PO TABS: 1.0000 | ORAL_TABLET | Freq: Two times a day (BID) | ORAL | 0 refills | Status: DC

## 2017-08-09 NOTE — ED Notes (Signed)
See triage note  Presents with large abscess area to buttocks  Hx of same

## 2017-08-09 NOTE — ED Provider Notes (Signed)
[facility] Emergency Department Provider Note ____________________________________________  Time seen: 1332  I have reviewed the triage vital signs and the nursing notes.  HISTORY  Chief Complaint  Abscess  HPI [name] is a 52 y.o. male presents to the ED for evaluation of a left buttocks abscess and cellulitis.  Patient reports a previous similar episode about 4 months prior.  He also reports that he completed a 7089-month course of continuous antibiotics about a month and a half prior.  He describes the area began to become tender and inflamed about 3 days prior.  He admits to squeezing and attempting to drain the area using a sewing needle yesterday.  He denies any spontaneous drainage, fevers, chills, or sweats.  He presents now for further evaluation and management.  He also admits to previously having been referred to surgery, but missing his scheduled appointment.  Past Medical History:  Diagnosis Date  . Hematuria   . History of kidney stones   . Left ureteral calculus   . Nephrolithiasis     There are no active problems to display for this patient.   Past Surgical History:  Procedure Laterality Date  . CYSTO/  URETEROSCOPY LASER LITHOTRIPSY WITH STONE EXTRACTION/  STENT PLACEMENT Right 2014   (raliegh)  . CYSTOSCOPY WITH URETEROSCOPY AND STENT PLACEMENT Left 12/23/2013   Procedure: LEFT URETEROSCOPY WITH STENT EXCHANGE, STONE EXTRACTION WITH BASKET;  Surgeon: Anner CreteJohn J Wrenn, MD;  [facility];  Service: Urology;  Laterality: Left;  . CYSTOSCOPY/RETROGRADE/URETEROSCOPY Left 12/08/2013   Procedure: CYSTOSCOPY/LEFT RETROGRADE PYELOGRAM/ INSERTION LEFT URETERAL STENT;  Surgeon: Anner CreteJohn J Wrenn, MD;  Location: [facility];  Service: Urology;  Laterality: Left;    Prior to Admission medications   Medication Sig Start Date End Date Taking? Authorizing Provider  aspirin 81 MG EC tablet Take 1 tablet (81 mg total) by mouth daily. Swallow  whole. 09/20/16   Emily FilbertWilliams, Jonathan E, MD  Multiple Vitamin (MULTIVITAMIN WITH MINERALS) TABS tablet Take 1 tablet by mouth daily.    [provider]  naproxen sodium (ANAPROX) 220 MG tablet Take 440 mg by mouth 2 (two) times daily as needed (pain).    [provider]  ondansetron (ZOFRAN) 4 MG tablet Take 1 tablet (4 mg total) by mouth every 8 (eight) hours as needed for nausea or vomiting. 06/26/16   Jeanmarie PlantMcShane, James A, MD  oxyCODONE-acetaminophen (PERCOCET) 5-325 MG tablet Take 1 tablet by mouth 3 (three) times daily as needed for up to 4 days for severe pain. 08/09/17 08/13/17  Demiah Gullickson, Charlesetta IvoryJenise V Bacon, PA-C  sulfamethoxazole-trimethoprim (BACTRIM DS,SEPTRA DS) 800-160 MG tablet Take 1 tablet by mouth 2 (two) times daily. 08/09/17   Arwa Yero, Charlesetta IvoryJenise V Bacon, PA-C  tamsulosin (FLOMAX) 0.4 MG CAPS capsule Take 1 capsule (0.4 mg total) by mouth daily. 07/01/17   Schaevitz, Myra Rudeavid Matthew, MD    Allergies Patient has no known allergies.  History reviewed. No pertinent family history.  Social History Social History   Tobacco Use  . Smoking status: Former Smoker    Packs/day: 0.50    Types: Cigarettes  . Smokeless tobacco: Never Used  Substance Use Topics  . Alcohol use: No  . Drug use: No    Review of Systems  Constitutional: Negative for fever. Cardiovascular: Negative for chest pain. Respiratory: Negative for shortness of breath. Gastrointestinal: Negative for abdominal pain, vomiting and diarrhea. Genitourinary: Negative for dysuria. Musculoskeletal: Negative for back pain. Skin: Negative for rash.  Buttocks abscess as above.  Neurological: Negative for headaches, focal weakness or numbness. ____________________________________________  PHYSICAL EXAM:  VITAL SIGNS: ED Triage Vitals  Enc Vitals Group     BP 08/09/17 1328 133/87     Pulse Rate 08/09/17 1328 (!) 103     Resp 08/09/17 1328 18     Temp 08/09/17 1328 98.5 F (36.9 C)     Temp Source 08/09/17 1328  Oral     SpO2 08/09/17 1328 97 %     Weight 08/09/17 1327 250 lb (113.4 kg)     Height 08/09/17 1327 5\' 8"  (1.727 m)     Head Circumference --      Peak Flow --      Pain Score 08/09/17 1326 10     Pain Loc --      Pain Edu? --      Excl. in GC? --     Constitutional: Alert and oriented. Well appearing and in no distress. Head: Normocephalic and atraumatic. Cardiovascular: Normal rate, regular rhythm. Normal distal pulses. Respiratory: Normal respiratory effort. Musculoskeletal: Nontender with normal range of motion in all extremities.  Neurologic:  Normal gait without ataxia. Normal speech and language. No gross focal neurologic deficits are appreciated. Skin:  Skin is warm, dry and intact. No rash noted. Left buttocks with a firm, erythematous area of induration measuring about 6 cm in diameter. No central fluctuance or pointing appreciated.  ____________________________________________  PROCEDURES  Bactrim DS 1 PO  .Marland KitchenIncision and Drainage Date/Time: 08/09/2017 4:00 PM Performed by: Lissa Hoard, PA-C Authorized by: Lissa Hoard, PA-C   Consent:    Consent obtained:  Verbal   Consent given by:  Patient   Risks discussed:  Incomplete drainage   Alternatives discussed:  Delayed treatment Location:    Type:  Abscess   Location:  Trunk   Trunk location: left buttock. Pre-procedure details:    Skin preparation:  Betadine Anesthesia (see MAR for exact dosages):    Anesthesia method:  Local infiltration   Local anesthetic:  Lidocaine 1% w/o epi Procedure type:    Complexity:  Simple Procedure details:    Needle aspiration: no     Incision types:  Single straight   Incision depth:  Subcutaneous   Scalpel blade:  11   Wound management:  Probed and deloculated and irrigated with saline   Drainage:  Bloody   Drainage amount:  Moderate   Wound treatment:  Drain placed   Packing materials:  1/4 in iodoform gauze   Amount 1/4" iodoform:  8  inches Post-procedure details:    Patient tolerance of procedure:  Tolerated well, no immediate complications  ____________________________________________  INITIAL IMPRESSION / ASSESSMENT AND PLAN / ED COURSE  Patient with ED evaluation management of a left abscess cellulitis.  Patient undergoes an I&D procedure in the ED with minimal drainage of purulent material.  The wound is flushed and packed with iodoform dressing.  Patient is given wound care instructions and supplies.  A prescription for Bactrim and Norco (#12) is provided as prescribed.  Patient will follow up with local community clinic or return to the ED in 3 days for wound check and packing removal.  He is again referred to surgery for definitive management.  ____________________________________________  FINAL CLINICAL IMPRESSION(S) / ED DIAGNOSES  Final diagnoses:  Abscess of buttock, left      Karmen Stabs, Charlesetta Ivory, PA-C 08/09/17 1609    Sharman Cheek, MD 08/09/17 2117

## 2017-08-09 NOTE — ED Triage Notes (Signed)
Here for abscess to left upper buttocks. Is not near rectum.  Had to have one drained about 2 months ago per pt. No fevers. Ambulatory.  VSS

## 2017-08-09 NOTE — ED Notes (Signed)
ED Provider at bedside. 

## 2017-08-09 NOTE — ED Notes (Signed)
Pt ambulatory upon discharge. Verbalized understanding of discharge instructions, follow-up care and prescriptions. VSS. Skin warm and dry. A&O x4.  

## 2017-08-09 NOTE — Discharge Instructions (Addendum)
You have had a buttock abscess drained and packed. Return to the ED for wound check and packing removal in 3 days. Take the antibiotic as directed and the pain medicine as needed. Apply warm compresses over the dressing, to promote healing. Follow-up with General Surgery for further management.

## 2017-08-16 ENCOUNTER — Telehealth: Payer: Self-pay | Admitting: Surgery

## 2017-08-16 ENCOUNTER — Ambulatory Visit: Payer: Self-pay | Admitting: Surgery

## 2017-08-16 NOTE — Telephone Encounter (Signed)
Patient no showed appointment on 4/118/19 with Dr. Everlene FarrierPabon, I have mailed a letter for the patient to contact our office and r/s no showed appointment.please r/s if patient calls.

## 2017-12-02 ENCOUNTER — Emergency Department
Admission: EM | Admit: 2017-12-02 | Discharge: 2017-12-02 | Disposition: A | Discharge status: Home / Self Care | Payer: Self-pay | Attending: Emergency Medicine | Admitting: Emergency Medicine

## 2017-12-02 ENCOUNTER — Encounter: Payer: Self-pay | Admitting: Emergency Medicine

## 2017-12-02 ENCOUNTER — Other Ambulatory Visit: Payer: Self-pay

## 2017-12-02 DIAGNOSIS — L739 Follicular disorder, unspecified: Secondary | ICD-10-CM | POA: Insufficient documentation

## 2017-12-02 DIAGNOSIS — Z7982 Long term (current) use of aspirin: Secondary | ICD-10-CM | POA: Insufficient documentation

## 2017-12-02 DIAGNOSIS — Z87891 Personal history of nicotine dependence: Secondary | ICD-10-CM | POA: Insufficient documentation

## 2017-12-02 DIAGNOSIS — L738 Other specified follicular disorders: Secondary | ICD-10-CM

## 2017-12-02 DIAGNOSIS — Z79899 Other long term (current) drug therapy: Secondary | ICD-10-CM | POA: Insufficient documentation

## 2017-12-02 MED ORDER — SULFAMETHOXAZOLE-TRIMETHOPRIM 800-160 MG PO TABS: 1.0000 | ORAL_TABLET | Freq: Two times a day (BID) | ORAL | 0 refills | Status: DC

## 2017-12-02 MED ORDER — HYDROCODONE-ACETAMINOPHEN 5-325 MG PO TABS: 1.0000 | ORAL_TABLET | Freq: Four times a day (QID) | ORAL | 0 refills | Status: DC | PRN

## 2017-12-02 NOTE — ED Notes (Signed)
Pt reports yesterday at 3 he started with pain under his upper right gum and now the pain is going up the side of his nose. Pt reports pain is so severe he cannot wear his partial plate. Pt denies injuries or SOB. Right nare is slightly red. Denies fevers, HA or other sx's.

## 2017-12-02 NOTE — ED Provider Notes (Signed)
[facility] Emergency Department Provider Note  ____________________________________________   First MD Initiated Contact with Patient 12/02/17 1250     (approximate)  I have reviewed the triage vital signs and the nursing notes.   HISTORY  Chief Complaint Facial Pain   HPI [name] is a 52 y.o. male presents to the emergency department with complaint of a bump inside his right naris which has become very swollen and extremely tender to touch.  Patient states that this morning he feels pain around the right side of his nose and down into the lip area.  Patient is unaware of any actual fever or chills.  He has not taken any over-the-counter medication.  He is unaware of any actual injury to his nose but could possibly have "pulled some hairs out".  Currently rates his pain as a 10/10.   Past Medical History:  Diagnosis Date  . Hematuria   . History of kidney stones   . Left ureteral calculus   . Nephrolithiasis     There are no active problems to display for this patient.   Past Surgical History:  Procedure Laterality Date  . CYSTO/  URETEROSCOPY LASER LITHOTRIPSY WITH STONE EXTRACTION/  STENT PLACEMENT Right 2014   (raliegh)  . CYSTOSCOPY WITH URETEROSCOPY AND STENT PLACEMENT Left 12/23/2013   Procedure: LEFT URETEROSCOPY WITH STENT EXCHANGE, STONE EXTRACTION WITH BASKET;  Surgeon: Anner Crete, MD;  Location: [facility];  Service: Urology;  Laterality: Left;  . CYSTOSCOPY/RETROGRADE/URETEROSCOPY Left 12/08/2013   Procedure: CYSTOSCOPY/LEFT RETROGRADE PYELOGRAM/ INSERTION LEFT URETERAL STENT;  Surgeon: Anner Crete, MD;  Location: [facility];  Service: Urology;  Laterality: Left;    Prior to Admission medications   Medication Sig Start Date End Date Taking? Authorizing Provider  aspirin 81 MG EC tablet Take 1 tablet (81 mg total) by mouth daily. Swallow whole. 09/20/16   Emily Filbert, MD  HYDROcodone-acetaminophen  (NORCO/VICODIN) 5-325 MG tablet Take 1 tablet by mouth every 6 (six) hours as needed for moderate pain. 12/02/17   Tommi Rumps, PA-C  Multiple Vitamin (MULTIVITAMIN WITH MINERALS) TABS tablet Take 1 tablet by mouth daily.    [provider]  naproxen sodium (ANAPROX) 220 MG tablet Take 440 mg by mouth 2 (two) times daily as needed (pain).    [provider]  sulfamethoxazole-trimethoprim (BACTRIM DS,SEPTRA DS) 800-160 MG tablet Take 1 tablet by mouth 2 (two) times daily. 12/02/17   Tommi Rumps, PA-C  tamsulosin (FLOMAX) 0.4 MG CAPS capsule Take 1 capsule (0.4 mg total) by mouth daily. 07/01/17   Schaevitz, Myra Rude, MD    Allergies Patient has no known allergies.  No family history on file.  Social History Social History   Tobacco Use  . Smoking status: Former Smoker    Packs/day: 0.50    Types: Cigarettes  . Smokeless tobacco: Never Used  Substance Use Topics  . Alcohol use: No  . Drug use: No    Review of Systems Constitutional: No fever/chills Eyes: No visual changes. ENT: Positive painful right nares. Cardiovascular: Denies chest pain. Skin: Positive bump right naris. Neurological: Negative for headaches, focal weakness or numbness. ___________________________________________   PHYSICAL EXAM:  VITAL SIGNS: ED Triage Vitals [12/02/17 1226]  Enc Vitals Group     BP (!) 160/98     Pulse Rate (!) 102     Resp 18     Temp 98.9 F (37.2 C)     Temp Source Oral  SpO2 97 %     Weight 250 lb (113.4 kg)     Height 5\' 8"  (1.727 m)     Head Circumference      Peak Flow      Pain Score 10     Pain Loc      Pain Edu?      Excl. in GC?    Constitutional: Alert and oriented. Well appearing and in no acute distress. Eyes: Conjunctivae are normal.  Head: Atraumatic. Nose: Anterior portion right nares medial aspect is moderately tender with some minimal erythematous soft tissue edema.  No drainage is noted from this area.  It appears to be  an erythematous papule.  This does not obstruct the nadirs. Mouth/Throat: Mucous membranes are moist.  Oropharynx non-erythematous.  Lip is nonedematous. Neck: No stridor.   Hematological/Lymphatic/Immunilogical: No cervical lymphadenopathy. Cardiovascular: Normal rate, regular rhythm. Grossly normal heart sounds.  Good peripheral circulation. Respiratory: Normal respiratory effort.  No retractions. Lungs CTAB. Musculoskeletal: Moves upper and lower extremities without any difficulty.  Normal gait was noted. Neurologic:  Normal speech and language. No gross focal neurologic deficits are appreciated.  Skin:  Skin is warm, dry and intact.  Nose as noted above. Psychiatric: Mood and affect are normal. Speech and behavior are normal.  ____________________________________________   LABS (all labs ordered are listed, but only abnormal results are displayed)  Labs Reviewed - No data to display   PROCEDURES  Procedure(s) performed: None  Procedures  Critical Care performed: No  ____________________________________________   INITIAL IMPRESSION / ASSESSMENT AND PLAN / ED COURSE  As part of my medical decision making, I reviewed the following data within the electronic MEDICAL RECORD NUMBER Notes from prior ED visits and [place] Controlled Substance Database  Patient presents with a erythematous nodule just within the right naris.  Patient was started on Bactrim DS twice daily for 10 days along with Norco if needed for pain every 6 hours.  He is instructed to use warm compresses to the area.  [facility] if any continued problems.  ____________________________________________   FINAL CLINICAL IMPRESSION(S) / ED DIAGNOSES  Final diagnoses:  Folliculitis nares perforans     ED Discharge Orders        Ordered    sulfamethoxazole-trimethoprim (BACTRIM DS,SEPTRA DS) 800-160 MG tablet  2 times daily     12/02/17 1321    HYDROcodone-acetaminophen  (NORCO/VICODIN) 5-325 MG tablet  Every 6 hours PRN     12/02/17 1321       Note:  This document was prepared using Dragon voice recognition software and may include unintentional dictation errors.    Tommi RumpsSummers, Tameya Kuznia L, PA-C 12/02/17 1332    Sharyn CreamerQuale, Mark, MD 12/02/17 [phone]

## 2017-12-02 NOTE — ED Notes (Signed)
Pt verbalizes understanding of d/c instructions, medications and follow up 

## 2017-12-02 NOTE — Discharge Instructions (Signed)
Follow-up with [facility] acute care if any continued problems.  You may also begin applying warm compresses to your face frequently.  Get prescriptions filled.  Bactrim DS twice daily for 10 days.  Norco is 1 tablet every 6 hours as needed for pain.

## 2017-12-02 NOTE — ED Triage Notes (Signed)
Pt presents to ED via POV c/o facial pain starting at nose and upper lip. Pt unable to wear partial due to pain. Minimal swelling noted to upper lip and no injury present. Pt in NAD at this time.

## 2018-01-24 ENCOUNTER — Encounter: Payer: Self-pay | Admitting: Emergency Medicine

## 2018-01-24 ENCOUNTER — Emergency Department
Admission: EM | Admit: 2018-01-24 | Discharge: 2018-01-24 | Disposition: A | Discharge status: Home / Self Care | Payer: Self-pay | Attending: Emergency Medicine | Admitting: Emergency Medicine

## 2018-01-24 DIAGNOSIS — J340 Abscess, furuncle and carbuncle of nose: Secondary | ICD-10-CM | POA: Insufficient documentation

## 2018-01-24 DIAGNOSIS — Z7982 Long term (current) use of aspirin: Secondary | ICD-10-CM | POA: Insufficient documentation

## 2018-01-24 DIAGNOSIS — Z79899 Other long term (current) drug therapy: Secondary | ICD-10-CM | POA: Insufficient documentation

## 2018-01-24 DIAGNOSIS — Z87891 Personal history of nicotine dependence: Secondary | ICD-10-CM | POA: Insufficient documentation

## 2018-01-24 MED ORDER — MUPIROCIN CALCIUM 2 % NA OINT: 1.0000 "application " | TOPICAL_OINTMENT | Freq: Two times a day (BID) | NASAL | 0 refills | Status: DC

## 2018-01-24 MED ORDER — LIDOCAINE VISCOUS HCL 2 % MT SOLN: 5.0000 mL | Freq: Four times a day (QID) | OROMUCOSAL | 0 refills | Status: DC | PRN

## 2018-01-24 MED ORDER — OXYCODONE-ACETAMINOPHEN 7.5-325 MG PO TABS: 1.0000 | ORAL_TABLET | Freq: Four times a day (QID) | ORAL | 0 refills | Status: DC | PRN

## 2018-01-24 MED ORDER — SULFAMETHOXAZOLE-TRIMETHOPRIM 800-160 MG PO TABS: 1.0000 | ORAL_TABLET | Freq: Two times a day (BID) | ORAL | 0 refills | Status: DC

## 2018-01-24 NOTE — ED Triage Notes (Signed)
Pt reports has an abscess on the left side of his nose. Pt states that he was seen for the same thing over a month ago on the other side of his nose and they told him it was an ingrown hair. Pt also reports he keeps getting little abscesses on his body and now has one on his right arm and the back of his neck.

## 2018-01-24 NOTE — ED Provider Notes (Signed)
[facility] Emergency Department Provider Note   ____________________________________________   First MD Initiated Contact with Patient 01/24/18 1009     (approximate)  I have reviewed the triage vital signs and the nursing notes.   HISTORY  Chief Complaint Abscess    HPI [name] is a 52 y.o. male patient presents with abscess to the left nostril and left upper lip.  Patient also has satellite lesion to the back of his neck and bilateral upper extremities.  Patient has a history of folliculitis.  Patient states he was pulling the hairs out of his nose last week and noticed some bleeding.  Patient states since the accident increased swelling and pain to the nose and upper lip.  Patient denies drainage from lesions.  Patient rates pain as a 9/10.  Patient described the pain is "achy".  No palates this measures for complaint.   Past Medical History:  Diagnosis Date  . Hematuria   . History of kidney stones   . Left ureteral calculus   . Nephrolithiasis     There are no active problems to display for this patient.   Past Surgical History:  Procedure Laterality Date  . CYSTO/  URETEROSCOPY LASER LITHOTRIPSY WITH STONE EXTRACTION/  STENT PLACEMENT Right 2014   (raliegh)  . CYSTOSCOPY WITH URETEROSCOPY AND STENT PLACEMENT Left 12/23/2013   Procedure: LEFT URETEROSCOPY WITH STENT EXCHANGE, STONE EXTRACTION WITH BASKET;  Surgeon: Anner Crete, MD;  Location: [facility];  Service: Urology;  Laterality: Left;  . CYSTOSCOPY/RETROGRADE/URETEROSCOPY Left 12/08/2013   Procedure: CYSTOSCOPY/LEFT RETROGRADE PYELOGRAM/ INSERTION LEFT URETERAL STENT;  Surgeon: Anner Crete, MD;  Location: [facility];  Service: Urology;  Laterality: Left;    Prior to Admission medications   Medication Sig Start Date End Date Taking? Authorizing Provider  aspirin 81 MG EC tablet Take 1 tablet (81 mg total) by mouth daily. Swallow whole. 09/20/16   Emily Filbert, MD   HYDROcodone-acetaminophen (NORCO/VICODIN) 5-325 MG tablet Take 1 tablet by mouth every 6 (six) hours as needed for moderate pain. 12/02/17   Tommi Rumps, PA-C  lidocaine (XYLOCAINE) 2 % solution Use as directed 5 mLs in the mouth or throat every 6 (six) hours as needed for mouth pain. Apply as directed to nose and lips. 01/24/18   Joni Reining, PA-C  Multiple Vitamin (MULTIVITAMIN WITH MINERALS) TABS tablet Take 1 tablet by mouth daily.    [provider]  mupirocin nasal ointment (BACTROBAN NASAL) 2 % Place 1 application into the nose 2 (two) times daily. Use one-half of tube in each nostril twice daily for five (5) days. 01/24/18   Joni Reining, PA-C  naproxen sodium (ANAPROX) 220 MG tablet Take 440 mg by mouth 2 (two) times daily as needed (pain).    [provider]  oxyCODONE-acetaminophen (PERCOCET) 7.5-325 MG tablet Take 1 tablet by mouth every 6 (six) hours as needed for severe pain. 01/24/18   Joni Reining, PA-C  sulfamethoxazole-trimethoprim (BACTRIM DS,SEPTRA DS) 800-160 MG tablet Take 1 tablet by mouth 2 (two) times daily. 12/02/17   Tommi Rumps, PA-C  sulfamethoxazole-trimethoprim (BACTRIM DS,SEPTRA DS) 800-160 MG tablet Take 1 tablet by mouth 2 (two) times daily. 01/24/18   Joni Reining, PA-C  tamsulosin (FLOMAX) 0.4 MG CAPS capsule Take 1 capsule (0.4 mg total) by mouth daily. 07/01/17   Schaevitz, Myra Rude, MD    Allergies Patient has no known allergies.  No family history on file.  Social History Social History   Tobacco Use  . Smoking status: Former Smoker    Packs/day: 0.50    Types: Cigarettes  . Smokeless tobacco: Never Used  Substance Use Topics  . Alcohol use: No  . Drug use: No    Review of Systems  Constitutional: No fever/chills Eyes: No visual changes. ENT: No sore throat. Cardiovascular: Denies chest pain. Respiratory: Denies shortness of breath. Gastrointestinal: No abdominal pain.  No nausea, no vomiting.  No  diarrhea.  No constipation. Genitourinary: Negative for dysuria. Musculoskeletal: Negative for back pain. Skin: Multiple abscesses. Neurological: Negative for headaches, focal weakness or numbness.   ____________________________________________   PHYSICAL EXAM:  VITAL SIGNS: ED Triage Vitals  Enc Vitals Group     BP 01/24/18 1024 (!) 143/95     Pulse Rate 01/24/18 1024 (!) 106     Resp 01/24/18 1024 20     Temp 01/24/18 1024 98.2 F (36.8 C)     Temp Source 01/24/18 1024 Oral     SpO2 01/24/18 1024 97 %     Weight 01/24/18 1025 250 lb (113.4 kg)     Height 01/24/18 1025 5\' 8"  (1.727 m)     Head Circumference --      Peak Flow --      Pain Score 01/24/18 1024 9     Pain Loc --      Pain Edu? --      Excl. in GC? --    Constitutional: Alert and oriented. Well appearing and in no acute distress. Nose: Left nasal edema.   Mouth/Throat: Erythema and edema left upper lip.  Mucous membranes are moist.  Oropharynx non-erythematous. Neck: No stridor Cardiovascular: Normal rate, regular rhythm. Grossly normal heart sounds.  Good peripheral circulation. Respiratory: Normal respiratory effort.  No retractions. Lungs CTAB. Gastrointestinal: Soft and nontender. No distention. No abdominal bruits. No CVA tenderness. Skin: Edema and erythema to the left nostril and left upper lip.  Papular lesions on nonfluctuant.   Psychiatric: Mood and affect are normal. Speech and behavior are normal.  ____________________________________________   LABS (all labs ordered are listed, but only abnormal results are displayed)  Labs Reviewed - No data to display ____________________________________________  EKG   ____________________________________________  RADIOLOGY  ED MD interpretation:    Official radiology report(s): No results found.  ____________________________________________   PROCEDURES  Procedure(s) performed: None  Procedures  Critical Care performed:  No  ____________________________________________   INITIAL IMPRESSION / ASSESSMENT AND PLAN / ED COURSE  As part of my medical decision making, I reviewed the following data within the electronic MEDICAL RECORD NUMBER    Nasal and upper lip abscess.  Patient given discharge care instruction advised take medication as directed.  Patient advised to use an antibacterial soap.  [facility].      ____________________________________________   FINAL CLINICAL IMPRESSION(S) / ED DIAGNOSES  Final diagnoses:  Abscess of nasal cavity     ED Discharge Orders         Ordered    sulfamethoxazole-trimethoprim (BACTRIM DS,SEPTRA DS) 800-160 MG tablet  2 times daily     01/24/18 1118    mupirocin nasal ointment (BACTROBAN NASAL) 2 %  2 times daily     01/24/18 1118    lidocaine (XYLOCAINE) 2 % solution  Every 6 hours PRN     01/24/18 1118    oxyCODONE-acetaminophen (PERCOCET) 7.5-325 MG tablet  Every 6 hours PRN     01/24/18 1118  Note:  This document was prepared using Dragon voice recognition software and may include unintentional dictation errors.    Joni Reining, PA-C 01/24/18 1124    Emily Filbert, MD 01/24/18 1256

## 2018-01-24 NOTE — ED Notes (Signed)
FIRST NURSE NOTE:  Pt reports boil in nose and several on the back of his neck.  Has hx of the same.

## 2018-01-24 NOTE — Discharge Instructions (Signed)
Follow discharge care instruction use antibacterial soap as directed.  Take medicine as directed.

## 2018-04-20 ENCOUNTER — Emergency Department
Admission: EM | Admit: 2018-04-20 | Discharge: 2018-04-20 | Disposition: A | Discharge status: Home / Self Care | Payer: Self-pay | Attending: Emergency Medicine | Admitting: Emergency Medicine

## 2018-04-20 ENCOUNTER — Encounter: Payer: Self-pay | Admitting: Medical Oncology

## 2018-04-20 DIAGNOSIS — L0231 Cutaneous abscess of buttock: Secondary | ICD-10-CM | POA: Insufficient documentation

## 2018-04-20 DIAGNOSIS — Z87891 Personal history of nicotine dependence: Secondary | ICD-10-CM | POA: Insufficient documentation

## 2018-04-20 DIAGNOSIS — Z7982 Long term (current) use of aspirin: Secondary | ICD-10-CM | POA: Insufficient documentation

## 2018-04-20 DIAGNOSIS — L0291 Cutaneous abscess, unspecified: Secondary | ICD-10-CM

## 2018-04-20 MED ORDER — NAPROXEN 500 MG PO TABS: 500.0000 mg | ORAL_TABLET | Freq: Two times a day (BID) | ORAL | Status: DC

## 2018-04-20 MED ORDER — CEPHALEXIN 500 MG PO CAPS: 500.0000 mg | ORAL_CAPSULE | Freq: Four times a day (QID) | ORAL | 0 refills | Status: AC

## 2018-04-20 MED ORDER — SULFAMETHOXAZOLE-TRIMETHOPRIM 800-160 MG PO TABS
1.0000 | ORAL_TABLET | Freq: Once | ORAL | Status: AC
  Administered 2018-04-20: 1 via ORAL
  Filled 2018-04-20: qty 1

## 2018-04-20 MED ORDER — SULFAMETHOXAZOLE-TRIMETHOPRIM 800-160 MG PO TABS: 1.0000 | ORAL_TABLET | Freq: Two times a day (BID) | ORAL | 0 refills | Status: DC

## 2018-04-20 MED ORDER — OXYCODONE-ACETAMINOPHEN 7.5-325 MG PO TABS: 1.0000 | ORAL_TABLET | Freq: Four times a day (QID) | ORAL | 0 refills | Status: DC | PRN

## 2018-04-20 MED ORDER — HYDROMORPHONE HCL 1 MG/ML IJ SOLN
1.0000 mg | Freq: Once | INTRAMUSCULAR | Status: AC
  Administered 2018-04-20: 1 mg via INTRAMUSCULAR
  Filled 2018-04-20: qty 1

## 2018-04-20 MED ORDER — CEPHALEXIN 500 MG PO CAPS
500.0000 mg | ORAL_CAPSULE | Freq: Once | ORAL | Status: AC
  Administered 2018-04-20: 500 mg via ORAL
  Filled 2018-04-20: qty 1

## 2018-04-20 NOTE — ED Triage Notes (Signed)
Abscess to buttock x 2 days.

## 2018-04-20 NOTE — ED Provider Notes (Signed)
[facility] Emergency Department Provider Note   ____________________________________________   First MD Initiated Contact with Patient 04/20/18 1314     (approximate)  I have reviewed the triage vital signs and the nursing notes.   HISTORY  Chief Complaint Abscess    HPI [name] is a 52 y.o. male presents with an abscess between buttocks for 2 days.  This is a chronic complaint for this patient.  Patient was seen approximately 8 months ago and admits to not following up with the surgical clinic as directed.  Patient denies drainage at this time.  Patient denies fever associated this complaint.  Patient rates his pain as 10/10.  Patient described the pain is "aching".  No palliative measure for complaint.  Past Medical History:  Diagnosis Date  . Hematuria   . History of kidney stones   . Left ureteral calculus   . Nephrolithiasis     There are no active problems to display for this patient.   Past Surgical History:  Procedure Laterality Date  . CYSTO/  URETEROSCOPY LASER LITHOTRIPSY WITH STONE EXTRACTION/  STENT PLACEMENT Right 2014   (raliegh)  . CYSTOSCOPY WITH URETEROSCOPY AND STENT PLACEMENT Left 12/23/2013   Procedure: LEFT URETEROSCOPY WITH STENT EXCHANGE, STONE EXTRACTION WITH BASKET;  Surgeon: Anner CreteJohn J Wrenn, MD;  [facility];  Service: Urology;  Laterality: Left;  . CYSTOSCOPY/RETROGRADE/URETEROSCOPY Left 12/08/2013   Procedure: CYSTOSCOPY/LEFT RETROGRADE PYELOGRAM/ INSERTION LEFT URETERAL STENT;  Surgeon: Anner CreteJohn J Wrenn, MD;  Location: [facility];  Service: Urology;  Laterality: Left;    Prior to Admission medications   Medication Sig Start Date End Date Taking? Authorizing Provider  aspirin 81 MG EC tablet Take 1 tablet (81 mg total) by mouth daily. Swallow whole. 09/20/16   Emily FilbertWilliams, Jonathan E, MD  cephALEXin (KEFLEX) 500 MG capsule Take 1 capsule (500 mg total) by mouth 4 (four) times daily for 10 days. 04/20/18  04/30/18  Joni ReiningSmith, Minervia Osso K, PA-C  HYDROcodone-acetaminophen (NORCO/VICODIN) 5-325 MG tablet Take 1 tablet by mouth every 6 (six) hours as needed for moderate pain. 12/02/17   Tommi RumpsSummers, Rhonda L, PA-C  lidocaine (XYLOCAINE) 2 % solution Use as directed 5 mLs in the mouth or throat every 6 (six) hours as needed for mouth pain. Apply as directed to nose and lips. 01/24/18   Joni ReiningSmith, Minda Faas K, PA-C  Multiple Vitamin (MULTIVITAMIN WITH MINERALS) TABS tablet Take 1 tablet by mouth daily.    [provider]  mupirocin nasal ointment (BACTROBAN NASAL) 2 % Place 1 application into the nose 2 (two) times daily. Use one-half of tube in each nostril twice daily for five (5) days. 01/24/18   Joni ReiningSmith, Siya Flurry K, PA-C  naproxen (NAPROSYN) 500 MG tablet Take 1 tablet (500 mg total) by mouth 2 (two) times daily with a meal. 04/20/18   Joni ReiningSmith, Rio Taber K, PA-C  naproxen sodium (ANAPROX) 220 MG tablet Take 440 mg by mouth 2 (two) times daily as needed (pain).    [provider]  oxyCODONE-acetaminophen (PERCOCET) 7.5-325 MG tablet Take 1 tablet by mouth every 6 (six) hours as needed for severe pain. 01/24/18   Joni ReiningSmith, Othon Guardia K, PA-C  oxyCODONE-acetaminophen (PERCOCET) 7.5-325 MG tablet Take 1 tablet by mouth every 6 (six) hours as needed. 04/20/18   Joni ReiningSmith, Everardo Voris K, PA-C  sulfamethoxazole-trimethoprim (BACTRIM DS,SEPTRA DS) 800-160 MG tablet Take 1 tablet by mouth 2 (two) times daily. 12/02/17   Tommi RumpsSummers, Rhonda L, PA-C  sulfamethoxazole-trimethoprim (BACTRIM DS,SEPTRA DS) 800-160 MG tablet  Take 1 tablet by mouth 2 (two) times daily. 01/24/18   Joni ReiningSmith, Rivaldo Hineman K, PA-C  sulfamethoxazole-trimethoprim (BACTRIM DS,SEPTRA DS) 800-160 MG tablet Take 1 tablet by mouth 2 (two) times daily. 04/20/18   Joni ReiningSmith, Jaimere Feutz K, PA-C  tamsulosin (FLOMAX) 0.4 MG CAPS capsule Take 1 capsule (0.4 mg total) by mouth daily. 07/01/17   Schaevitz, Myra Rudeavid Matthew, MD    Allergies Patient has no known allergies.  No family history on file.  Social  History Social History   Tobacco Use  . Smoking status: Former Smoker    Packs/day: 0.50    Types: Cigarettes  . Smokeless tobacco: Never Used  Substance Use Topics  . Alcohol use: No  . Drug use: No    Review of Systems  Constitutional: No fever/chills Eyes: No visual changes. ENT: No sore throat. Cardiovascular: Denies chest pain. Respiratory: Denies shortness of breath. Gastrointestinal: No abdominal pain.  No nausea, no vomiting.  No diarrhea.  No constipation. Genitourinary: Negative for dysuria. Musculoskeletal: Negative for back pain. Skin: Negative for rash.  Nodule lesion between buttocks. Neurological: Negative for headaches, focal weakness or numbness.   ____________________________________________   PHYSICAL EXAM:  VITAL SIGNS: ED Triage Vitals  Enc Vitals Group     BP 04/20/18 1305 (!) 156/97     Pulse Rate 04/20/18 1305 94     Resp 04/20/18 1305 20     Temp 04/20/18 1305 97.9 F (36.6 C)     Temp Source 04/20/18 1305 Oral     SpO2 04/20/18 1305 97 %     Weight 04/20/18 1303 249 lb 1.9 oz (113 kg)     Height 04/20/18 1306 5\' 9"  (1.753 m)     Head Circumference --      Peak Flow --      Pain Score 04/20/18 1303 10     Pain Loc --      Pain Edu? --      Excl. in GC? --    Constitutional: Alert and oriented. Well appearing and in no acute distress. Neck: No stridor. Hematological/Lymphatic/Immunilogical: No cervical lymphadenopathy. Cardiovascular: Normal rate, regular rhythm. Grossly normal heart sounds.  Good peripheral circulation. Respiratory: Normal respiratory effort.  No retractions. Lungs CTAB. Gastrointestinal: Soft and nontender. No distention. No abdominal bruits. No CVA tenderness. Musculoskeletal: No lower extremity tenderness nor edema.  No joint effusions. Neurologic:  Normal speech and language. No gross focal neurologic deficits are appreciated. No gait instability. Skin: Nonfluctuant nodule lesion between the buttocks.     Psychiatric: Mood and affect are normal. Speech and behavior are normal.  ____________________________________________   LABS (all labs ordered are listed, but only abnormal results are displayed)  Labs Reviewed - No data to display ____________________________________________  EKG   ____________________________________________  RADIOLOGY  ED MD interpretation:    Official radiology report(s): No results found.  ____________________________________________   PROCEDURES  Procedure(s) performed: None  Procedures  Critical Care performed: No  ____________________________________________   INITIAL IMPRESSION / ASSESSMENT AND PLAN / ED COURSE  As part of my medical decision making, I reviewed the following data within the electronic MEDICAL RECORD NUMBER    Patient presents for abscess between his body for 2 days.  Lesion is nonfluctuant and discussed with patient rationale for not incised and drained at this time.  Patient advised take medication as directed.  Patient advised again the definitive care of can be provided by seeking a surgical consultation.      ____________________________________________   FINAL CLINICAL IMPRESSION(S) / ED  DIAGNOSES  Final diagnoses:  Abscess     ED Discharge Orders         Ordered    cephALEXin (KEFLEX) 500 MG capsule  4 times daily     04/20/18 1349    sulfamethoxazole-trimethoprim (BACTRIM DS,SEPTRA DS) 800-160 MG tablet  2 times daily     04/20/18 1349    naproxen (NAPROSYN) 500 MG tablet  2 times daily with meals     04/20/18 1349    oxyCODONE-acetaminophen (PERCOCET) 7.5-325 MG tablet  Every 6 hours PRN     04/20/18 1349           Note:  This document was prepared using Dragon voice recognition software and may include unintentional dictation errors.    Joni Reining, PA-C 04/20/18 1356    Sharyn Creamer, MD 04/20/18 [phone]

## 2018-04-20 NOTE — Discharge Instructions (Addendum)
Abscess is nonfluctuant this time.  Advised to follow discharge care instructions and take medication as directed.

## 2018-10-27 ENCOUNTER — Other Ambulatory Visit: Payer: Self-pay

## 2018-10-27 ENCOUNTER — Emergency Department
Admission: EM | Admit: 2018-10-27 | Discharge: 2018-10-27 | Disposition: A | Discharge status: Home / Self Care | Payer: Self-pay | Attending: Emergency Medicine | Admitting: Emergency Medicine

## 2018-10-27 DIAGNOSIS — R1032 Left lower quadrant pain: Secondary | ICD-10-CM | POA: Insufficient documentation

## 2018-10-27 DIAGNOSIS — R109 Unspecified abdominal pain: Secondary | ICD-10-CM

## 2018-10-27 DIAGNOSIS — R110 Nausea: Secondary | ICD-10-CM | POA: Insufficient documentation

## 2018-10-27 DIAGNOSIS — Z7982 Long term (current) use of aspirin: Secondary | ICD-10-CM | POA: Insufficient documentation

## 2018-10-27 DIAGNOSIS — N200 Calculus of kidney: Secondary | ICD-10-CM | POA: Insufficient documentation

## 2018-10-27 DIAGNOSIS — Z960 Presence of urogenital implants: Secondary | ICD-10-CM | POA: Insufficient documentation

## 2018-10-27 DIAGNOSIS — F17210 Nicotine dependence, cigarettes, uncomplicated: Secondary | ICD-10-CM | POA: Insufficient documentation

## 2018-10-27 LAB — CBC
HCT: 44.7 % (ref 39.0–52.0)
Hemoglobin: 15.4 g/dL (ref 13.0–17.0)
MCH: 31.4 pg (ref 26.0–34.0)
MCHC: 34.5 g/dL (ref 30.0–36.0)
MCV: 91 fL (ref 80.0–100.0)
Platelets: 244 10*3/uL (ref 150–400)
RBC: 4.91 MIL/uL (ref 4.22–5.81)
RDW: 12.8 % (ref 11.5–15.5)
WBC: 9.2 10*3/uL (ref 4.0–10.5)
nRBC: 0 % (ref 0.0–0.2)

## 2018-10-27 LAB — URINALYSIS, COMPLETE (UACMP) WITH MICROSCOPIC
Bacteria, UA: NONE SEEN
Bilirubin Urine: NEGATIVE
Glucose, UA: NEGATIVE mg/dL
Ketones, ur: NEGATIVE mg/dL
Nitrite: NEGATIVE
Protein, ur: NEGATIVE mg/dL
RBC / HPF: >50 RBC/hpf — ABNORMAL HIGH (ref 0–5)
Specific Gravity, Urine: 1.011 (ref 1.005–1.030)
pH: 5 (ref 5.0–8.0)

## 2018-10-27 LAB — BASIC METABOLIC PANEL
Anion gap: 10 (ref 5–15)
BUN: 13 mg/dL (ref 6–20)
CO2: 21 mmol/L — ABNORMAL LOW (ref 22–32)
Calcium: 8.6 mg/dL — ABNORMAL LOW (ref 8.9–10.3)
Chloride: 105 mmol/L (ref 98–111)
Creatinine, Ser: 1.01 mg/dL (ref 0.61–1.24)
GFR calc Af Amer: >60 mL/min (ref >60)
GFR calc non Af Amer: >60 mL/min (ref >60)
Glucose, Bld: 178 mg/dL — ABNORMAL HIGH (ref 70–99)
Potassium: 4 mmol/L (ref 3.5–5.1)
Sodium: 136 mmol/L (ref 135–145)

## 2018-10-27 MED ORDER — TAMSULOSIN HCL 0.4 MG PO CAPS
0.4000 mg | ORAL_CAPSULE | Freq: Once | ORAL | Status: AC
  Administered 2018-10-27: 19:00:00 0.4 mg via ORAL
  Filled 2018-10-27: qty 1

## 2018-10-27 MED ORDER — ONDANSETRON 4 MG PO TBDP: 4.0000 mg | ORAL_TABLET | Freq: Three times a day (TID) | ORAL | 0 refills | Status: DC | PRN

## 2018-10-27 MED ORDER — OXYCODONE-ACETAMINOPHEN 5-325 MG PO TABS: 1.0000 | ORAL_TABLET | ORAL | 0 refills | Status: DC | PRN

## 2018-10-27 MED ORDER — OXYCODONE-ACETAMINOPHEN 5-325 MG PO TABS
2.0000 | ORAL_TABLET | Freq: Once | ORAL | Status: AC
  Administered 2018-10-27: 19:00:00 2 via ORAL
  Filled 2018-10-27: qty 2

## 2018-10-27 MED ORDER — ONDANSETRON 4 MG PO TBDP
4.0000 mg | ORAL_TABLET | Freq: Once | ORAL | Status: AC
  Administered 2018-10-27: 19:00:00 4 mg via ORAL
  Filled 2018-10-27: qty 1

## 2018-10-27 MED ORDER — TAMSULOSIN HCL 0.4 MG PO CAPS: 0.4000 mg | ORAL_CAPSULE | Freq: Every day | ORAL | 0 refills | Status: AC

## 2018-10-27 MED ORDER — CEPHALEXIN 500 MG PO CAPS: 500.0000 mg | ORAL_CAPSULE | Freq: Four times a day (QID) | ORAL | 0 refills | Status: AC

## 2018-10-27 MED ORDER — CEPHALEXIN 500 MG PO CAPS
500.0000 mg | ORAL_CAPSULE | Freq: Once | ORAL | Status: AC
  Administered 2018-10-27: 500 mg via ORAL
  Filled 2018-10-27: qty 1

## 2018-10-27 NOTE — ED Notes (Signed)

## 2018-10-27 NOTE — Discharge Instructions (Addendum)
Please take pain medication as prescribed.  Make sure you are drinking lots of fluids.  If any difficulty urinating, fevers, increasing pain return to the ER immediately.  Please follow-up primary care provider in 2 to 3 days for recheck.  Use strainer

## 2018-10-27 NOTE — ED Provider Notes (Signed)
[facility] DEPARTMENT Provider Note   CSN: [id number] Arrival date & time: 10/27/18  1656     History   Chief Complaint Chief Complaint  Patient presents with  . Flank Pain    HPI [name] is a 53 y.o. male.  Presents to the emergency department evaluation of left-sided flank pain.  Flank pain began this morning along with nausea.  Patient states pain radiates into his left lower pelvis.  Pain is sharp worse with movement.  He has had some mild nausea but no vomiting.  No difficulty with urination.  No change in urine flow.  He denies any back pain or fevers.  Patient states he has a long history of kidney stones that have all resolved on their own.  He has had several CT scans in the past.  Denies any history of surgical intervention to remove stones.  Patient states the pain he is having today resembles his previous kidney stone pain.  Patient denies any new sexual partners, penile discharge, testicular pain or any concern for STD.    HPI  Past Medical History:  Diagnosis Date  . Hematuria   . History of kidney stones   . Left ureteral calculus   . Nephrolithiasis     There are no active problems to display for this patient.   Past Surgical History:  Procedure Laterality Date  . CYSTO/  URETEROSCOPY LASER LITHOTRIPSY WITH STONE EXTRACTION/  STENT PLACEMENT Right 2014   (raliegh)  . CYSTOSCOPY WITH URETEROSCOPY AND STENT PLACEMENT Left 12/23/2013   Procedure: LEFT URETEROSCOPY WITH STENT EXCHANGE, STONE EXTRACTION WITH BASKET;  Surgeon: Malka So, MD;  Location: [facility];  Service: Urology;  Laterality: Left;  . CYSTOSCOPY/RETROGRADE/URETEROSCOPY Left 12/08/2013   Procedure: CYSTOSCOPY/LEFT RETROGRADE PYELOGRAM/ INSERTION LEFT URETERAL STENT;  Surgeon: Malka So, MD;  Location: [facility];  Service: Urology;  Laterality: Left;        Home Medications    Prior to Admission medications   Medication Sig Start Date  End Date Taking? Authorizing Provider  aspirin 81 MG EC tablet Take 1 tablet (81 mg total) by mouth daily. Swallow whole. 09/20/16   Earleen Newport, MD  cephALEXin (KEFLEX) 500 MG capsule Take 1 capsule (500 mg total) by mouth 4 (four) times daily for 5 days. 10/27/18 11/01/18  Duanne Guess, PA-C  HYDROcodone-acetaminophen (NORCO/VICODIN) 5-325 MG tablet Take 1 tablet by mouth every 6 (six) hours as needed for moderate pain. 12/02/17   Johnn Hai, PA-C  lidocaine (XYLOCAINE) 2 % solution Use as directed 5 mLs in the mouth or throat every 6 (six) hours as needed for mouth pain. Apply as directed to nose and lips. 01/24/18   Sable Feil, PA-C  Multiple Vitamin (MULTIVITAMIN WITH MINERALS) TABS tablet Take 1 tablet by mouth daily.    [provider]  mupirocin nasal ointment (BACTROBAN NASAL) 2 % Place 1 application into the nose 2 (two) times daily. Use one-half of tube in each nostril twice daily for five (5) days. 01/24/18   Sable Feil, PA-C  naproxen (NAPROSYN) 500 MG tablet Take 1 tablet (500 mg total) by mouth 2 (two) times daily with a meal. 04/20/18   Sable Feil, PA-C  naproxen sodium (ANAPROX) 220 MG tablet Take 440 mg by mouth 2 (two) times daily as needed (pain).    [provider]  ondansetron (ZOFRAN ODT) 4 MG disintegrating tablet Take 1 tablet (4 mg total) by  mouth every 8 (eight) hours as needed for nausea or vomiting. 10/27/18   Evon SlackGaines,  C, PA-C  oxyCODONE-acetaminophen (PERCOCET) 5-325 MG tablet Take 1-2 tablets by mouth every 4 (four) hours as needed for severe pain. 10/27/18 10/27/19  Evon SlackGaines,  C, PA-C  sulfamethoxazole-trimethoprim (BACTRIM DS,SEPTRA DS) 800-160 MG tablet Take 1 tablet by mouth 2 (two) times daily. 12/02/17   Tommi RumpsSummers, Rhonda L, PA-C  sulfamethoxazole-trimethoprim (BACTRIM DS,SEPTRA DS) 800-160 MG tablet Take 1 tablet by mouth 2 (two) times daily. 01/24/18   Joni ReiningSmith, Ronald K, PA-C  sulfamethoxazole-trimethoprim (BACTRIM  DS,SEPTRA DS) 800-160 MG tablet Take 1 tablet by mouth 2 (two) times daily. 04/20/18   Joni ReiningSmith, Ronald K, PA-C  tamsulosin (FLOMAX) 0.4 MG CAPS capsule Take 1 capsule (0.4 mg total) by mouth daily after supper. 10/27/18   Evon SlackGaines,  C, PA-C    Family History No family history on file.  Social History Social History   Tobacco Use  . Smoking status: Current Every Day Smoker    Packs/day: 0.50    Types: Cigarettes  . Smokeless tobacco: Never Used  Substance Use Topics  . Alcohol use: Yes  . Drug use: No     Allergies   Patient has no known allergies.   Review of Systems Review of Systems  Constitutional: Negative for fever.  Respiratory: Negative for shortness of breath.   Cardiovascular: Negative for chest pain.  Gastrointestinal: Negative for abdominal pain, nausea and vomiting.  Genitourinary: Positive for flank pain. Negative for discharge, hematuria, penile pain, penile swelling, testicular pain and urgency.  Musculoskeletal: Negative for arthralgias and back pain.  Skin: Negative for rash and wound.  Neurological: Negative for headaches.     Physical Exam Updated Vital Signs BP 131/85 (BP Location: Left Arm)   Pulse 89   Temp 98.8 F (37.1 C) (Oral)   Resp 18   Ht 5\' 8"  (1.727 m)   Wt 113.4 kg   SpO2 96%   BMI 38.01 kg/m   Physical Exam Constitutional:      Appearance: He is well-developed.  HENT:     Head: Normocephalic and atraumatic.  Eyes:     Conjunctiva/sclera: Conjunctivae normal.  Neck:     Musculoskeletal: Normal range of motion. No neck rigidity or muscular tenderness.  Cardiovascular:     Rate and Rhythm: Normal rate.  Pulmonary:     Effort: Pulmonary effort is normal. No respiratory distress.     Breath sounds: Normal breath sounds. No wheezing or rales.  Abdominal:     General: Bowel sounds are normal. There is no distension.     Tenderness: There is no abdominal tenderness. There is no guarding.  Musculoskeletal: Normal range of  motion.     Comments: No CVA tenderness on exam.  No abdominal or flank tenderness.  Skin:    General: Skin is warm.     Findings: No rash.  Neurological:     General: No focal deficit present.     Mental Status: He is alert and oriented to person, place, and time.  Psychiatric:        Behavior: Behavior normal.        Thought Content: Thought content normal.      ED Treatments / Results  Labs (all labs ordered are listed, but only abnormal results are displayed) Labs Reviewed  URINALYSIS, COMPLETE (UACMP) WITH MICROSCOPIC - Abnormal; Notable for the following components:      Result Value   Color, Urine YELLOW (*)    APPearance HAZY (*)  Hgb urine dipstick LARGE (*)    Leukocytes,Ua SMALL (*)    RBC / HPF >50 (*)    All other components within normal limits  BASIC METABOLIC PANEL - Abnormal; Notable for the following components:   CO2 21 (*)    Glucose, Bld 178 (*)    Calcium 8.6 (*)    All other components within normal limits  URINE CULTURE  CBC    EKG    Radiology No results found.  Procedures Procedures (including critical care time)  Medications Ordered in ED Medications  cephALEXin (KEFLEX) capsule 500 mg (500 mg Oral Given 10/27/18 1904)  oxyCODONE-acetaminophen (PERCOCET/ROXICET) 5-325 MG per tablet 2 tablet (2 tablets Oral Given 10/27/18 1904)  tamsulosin (FLOMAX) capsule 0.4 mg (0.4 mg Oral Given 10/27/18 1904)  ondansetron (ZOFRAN-ODT) disintegrating tablet 4 mg (4 mg Oral Given 10/27/18 1904)     Initial Impression / Assessment and Plan / ED Course  I have reviewed the triage vital signs and the nursing notes.  Pertinent labs & imaging results that were available during my care of the patient were reviewed by me and considered in my medical decision making (see chart for details).        53 year old male with long history of kidney stones presents with similar presentation of previous kidney stones.  Patient describes left flank pain with mild  nausea worse with movement.  He is not running any fevers.  No complaints of back pain.  Urine flow is normal.  Blood work is normal, urinalysis shows slight increase in leukocytes and WBCs.  No penile discharge.  Previous urinalysis that shows similar findings in the past but cultures were negative.  Culture obtained today.  Patient is treated for pain for kidney stone and given cephalexin for possible UTI.  Patient educated on signs and symptoms return to ED for such as any increasing pain, difficulty urinating, fevers, back pain.  Final Clinical Impressions(s) / ED Diagnoses   Final diagnoses:  Left flank pain  Kidney stone    ED Discharge Orders         Ordered    oxyCODONE-acetaminophen (PERCOCET) 5-325 MG tablet  Every 4 hours PRN     10/27/18 1944    ondansetron (ZOFRAN ODT) 4 MG disintegrating tablet  Every 8 hours PRN     10/27/18 1944    tamsulosin (FLOMAX) 0.4 MG CAPS capsule  Daily after supper     10/27/18 1944    cephALEXin (KEFLEX) 500 MG capsule  4 times daily     10/27/18 1944           Ronnette JuniperGaines,  C, PA-C 10/27/18 1950    Dionne BucySiadecki, Sebastian, MD 10/27/18 [phone]

## 2018-10-27 NOTE — ED Triage Notes (Signed)
Pt c/o left flank pain that started this morning with nausea, states he has a hx of kidney stones and this feels the same.

## 2018-10-29 LAB — URINE CULTURE: Culture: NO GROWTH

## 2018-12-08 ENCOUNTER — Encounter: Payer: Self-pay | Admitting: Emergency Medicine

## 2018-12-08 ENCOUNTER — Other Ambulatory Visit: Payer: Self-pay

## 2018-12-08 ENCOUNTER — Emergency Department
Admission: EM | Admit: 2018-12-08 | Discharge: 2018-12-08 | Disposition: A | Discharge status: Home / Self Care | Payer: Self-pay | Attending: Student | Admitting: Student

## 2018-12-08 DIAGNOSIS — L0291 Cutaneous abscess, unspecified: Secondary | ICD-10-CM

## 2018-12-08 DIAGNOSIS — F17210 Nicotine dependence, cigarettes, uncomplicated: Secondary | ICD-10-CM | POA: Insufficient documentation

## 2018-12-08 DIAGNOSIS — L0211 Cutaneous abscess of neck: Secondary | ICD-10-CM | POA: Insufficient documentation

## 2018-12-08 DIAGNOSIS — Z7982 Long term (current) use of aspirin: Secondary | ICD-10-CM | POA: Insufficient documentation

## 2018-12-08 DIAGNOSIS — Z9889 Other specified postprocedural states: Secondary | ICD-10-CM

## 2018-12-08 MED ORDER — HYDROCODONE-ACETAMINOPHEN 5-325 MG PO TABS: 1.0000 | ORAL_TABLET | Freq: Three times a day (TID) | ORAL | 0 refills | Status: AC | PRN

## 2018-12-08 MED ORDER — SULFAMETHOXAZOLE-TRIMETHOPRIM 800-160 MG PO TABS
1.0000 | ORAL_TABLET | Freq: Once | ORAL | Status: AC
  Administered 2018-12-08: 08:00:00 1 via ORAL
  Filled 2018-12-08: qty 1

## 2018-12-08 MED ORDER — LIDOCAINE HCL (PF) 1 % IJ SOLN
5.0000 mL | Freq: Once | INTRAMUSCULAR | Status: AC
  Administered 2018-12-08: 5 mL
  Filled 2018-12-08: qty 5

## 2018-12-08 MED ORDER — SULFAMETHOXAZOLE-TRIMETHOPRIM 800-160 MG PO TABS: 1.0000 | ORAL_TABLET | Freq: Two times a day (BID) | ORAL | 0 refills | Status: DC

## 2018-12-08 NOTE — ED Triage Notes (Signed)
Pt arrived via POV with reports of abscess to back left side of the neck, pt states has happened before. No drainage noted at this time.

## 2018-12-08 NOTE — Discharge Instructions (Signed)
You have had an abscess opened and drained in the ED today. Keep the area clean, dry, and covered. Apply warm compresses over the dressing to promote healing. Follow-up with [facility] or return to the ED in 3 days for wound check and packing removal. Take the antibiotic as directed and the pain medicine as needed.

## 2018-12-08 NOTE — ED Provider Notes (Signed)
[facility] Emergency Department Provider Note ____________________________________________  Time seen: 0811  I have reviewed the triage vital signs and the nursing notes.  HISTORY  Chief Complaint  Abscess  HPI [name] is a 53 y.o. male presents himself to the ED for evaluation of a tender bulging abscess to the posterior neck.  Patient describes the area has been evolving for the last week. He attempted to squeeze the area, without benefit. He denies interim fevers, chills, sweats, or nausea. He has a history of recurrent abscess requiring I&D in the past.   Past Medical History:  Diagnosis Date  . Hematuria   . History of kidney stones   . Left ureteral calculus   . Nephrolithiasis     There are no active problems to display for this patient.   Past Surgical History:  Procedure Laterality Date  . CYSTO/  URETEROSCOPY LASER LITHOTRIPSY WITH STONE EXTRACTION/  STENT PLACEMENT Right 2014   (raliegh)  . CYSTOSCOPY WITH URETEROSCOPY AND STENT PLACEMENT Left 12/23/2013   Procedure: LEFT URETEROSCOPY WITH STENT EXCHANGE, STONE EXTRACTION WITH BASKET;  Surgeon: Malka So, MD;  Location: [facility];  Service: Urology;  Laterality: Left;  . CYSTOSCOPY/RETROGRADE/URETEROSCOPY Left 12/08/2013   Procedure: CYSTOSCOPY/LEFT RETROGRADE PYELOGRAM/ INSERTION LEFT URETERAL STENT;  Surgeon: Malka So, MD;  Location: [facility];  Service: Urology;  Laterality: Left;    Prior to Admission medications   Medication Sig Start Date End Date Taking? Authorizing Provider  aspirin 81 MG EC tablet Take 1 tablet (81 mg total) by mouth daily. Swallow whole. 09/20/16   Earleen Newport, MD  HYDROcodone-acetaminophen (NORCO) 5-325 MG tablet Take 1 tablet by mouth 3 (three) times daily as needed for up to 2 days. 12/08/18 12/10/18  Rhia Blatchford, Dannielle Karvonen, PA-C  Multiple Vitamin (MULTIVITAMIN WITH MINERALS) TABS tablet Take 1 tablet by mouth daily.    [provider]  naproxen sodium (ANAPROX) 220 MG tablet Take 440 mg by mouth 2 (two) times daily as needed (pain).    [provider]  sulfamethoxazole-trimethoprim (BACTRIM DS) 800-160 MG tablet Take 1 tablet by mouth 2 (two) times daily. 12/08/18   Sadi Arave, Dannielle Karvonen, PA-C  tamsulosin (FLOMAX) 0.4 MG CAPS capsule Take 1 capsule (0.4 mg total) by mouth daily after supper. 10/27/18   Duanne Guess, PA-C  mupirocin nasal ointment (BACTROBAN NASAL) 2 % Place 1 application into the nose 2 (two) times daily. Use one-half of tube in each nostril twice daily for five (5) days. 01/24/18 12/08/18  Sable Feil, PA-C    Allergies Patient has no known allergies.  History reviewed. No pertinent family history.  Social History Social History   Tobacco Use  . Smoking status: Current Every Day Smoker    Packs/day: 0.50    Types: Cigarettes  . Smokeless tobacco: Never Used  Substance Use Topics  . Alcohol use: Yes  . Drug use: No    Review of Systems  Constitutional: Negative for fever. Eyes: Negative for visual changes. ENT: Negative for sore throat. Cardiovascular: Negative for chest pain. Respiratory: Negative for shortness of breath. Gastrointestinal: Negative for abdominal pain, vomiting and diarrhea. Genitourinary: Negative for dysuria. Musculoskeletal: Negative for back pain. Skin: Negative for rash. Tender abscess to the posterior neck.  Neurological: Negative for headaches, focal weakness or numbness. ____________________________________________  PHYSICAL EXAM:  VITAL SIGNS: ED Triage Vitals  Enc Vitals Group     BP 12/08/18 0802 118/85  Pulse Rate 12/08/18 0801 (!) 109     Resp 12/08/18 0801 18     Temp 12/08/18 0801 98.3 F (36.8 C)     Temp Source 12/08/18 0801 Oral     SpO2 12/08/18 0801 96 %     Weight 12/08/18 0801 250 lb (113.4 kg)     Height 12/08/18 0801 5\' 8"  (1.727 m)     Head Circumference --      Peak Flow --      Pain Score 12/08/18 0801  10     Pain Loc --      Pain Edu? --      Excl. in GC? --     Constitutional: Alert and oriented. Well appearing and in no distress. Head: Normocephalic and atraumatic. Eyes: Conjunctivae are normal. Normal extraocular movements Neck: Supple. Posterior neck with a focal, pointing abscess formation to the left. Subcutaneous involvement measuring approximately 3 cm in diameter Hematological/Lymphatic/Immunological: No cervical lymphadenopathy. Cardiovascular: Normal rate, regular rhythm. Normal distal pulses. Respiratory: Normal respiratory effort.  Musculoskeletal: Nontender with normal range of motion in all extremities.  Neurologic:  Normal gait without ataxia. Normal speech and language. No gross focal neurologic deficits are appreciated. Skin:  Skin is warm, dry and intact. No rash noted. ____________________________________________  PROCEDURES  Bactrim DS 1 PO  .Marland Kitchen.Incision and Drainage  Date/Time: 12/08/2018 8:39 AM Performed by: Lissa HoardMenshew, Benen Weida V Bacon, PA-C Authorized by: Lissa HoardMenshew, Acacia Latorre V Bacon, PA-C   Consent:    Consent obtained:  Verbal   Consent given by:  Patient   Risks discussed:  Bleeding and pain   Alternatives discussed:  Alternative treatment Location:    Type:  Abscess   Location:  Neck   Neck location:  L posterior Pre-procedure details:    Skin preparation:  Betadine Anesthesia (see MAR for exact dosages):    Anesthesia method:  Local infiltration   Local anesthetic:  Lidocaine 1% w/o epi Procedure type:    Complexity:  Simple Procedure details:    Incision types:  Stab incision   Incision depth:  Subcutaneous   Scalpel blade:  11   Wound management:  Probed and deloculated and irrigated with saline   Drainage:  Purulent and bloody   Drainage amount:  Moderate   Packing materials:  1/4 in iodoform gauze   Amount 1/4" iodoform:  2 Post-procedure details:    Patient tolerance of procedure:  Tolerated well, no immediate  complications   ____________________________________________  INITIAL IMPRESSION / ASSESSMENT AND PLAN / ED COURSE  [name] was evaluated in Emergency Department on 12/08/2018 for the symptoms described in the history of present illness. He was evaluated in the context of the global COVID-19 pandemic, which necessitated consideration that the patient might be at risk for infection with the SARS-CoV-2 virus that causes COVID-19. Institutional protocols and algorithms that pertain to the evaluation of patients at risk for COVID-19 are in a state of rapid change based on information released by regulatory bodies including the CDC and federal and state organizations. These policies and algorithms were followed during the patient's care in the ED.  Patient with ED evaluation of an abscess to the left posterior neck.  Patient is status post I&D procedure with meaningful expression of purulent drainage.  Patient is improved at time of discharge.  The wound is packed and dressed the patient is discharged with wound care instructions and supplies.  He will follow[facility] or return to the ED in 3 days needed for  wound check and packing removal.  He is discharged with a prescription for Bactrim and hydrocodone (#6) for interim pain relief.  I reviewed the patient's prescription history over the last 12 months in the multi-state controlled substances database(s) that includes [place], [place], [place], [place], [place], [place], [place], [place], [place], [place], [place], [place], and [place].  Results were notable for no current prescriptions.  ____________________________________________  FINAL CLINICAL IMPRESSION(S) / ED DIAGNOSES  Final diagnoses:  Abscess  Status post incision and drainage      Elina Streng, Charlesetta IvoryJenise V Bacon, PA-C 12/08/18 0911    Miguel AschoffMonks, Sarah L., MD 12/08/18 [phone]

## 2018-12-08 NOTE — ED Notes (Addendum)
See triage assessment Cyst/boil to neck - has hx of same - no drainage - pt states increased in size over the last two days

## 2019-03-01 ENCOUNTER — Other Ambulatory Visit: Payer: Self-pay

## 2019-03-01 ENCOUNTER — Emergency Department
Admission: EM | Admit: 2019-03-01 | Discharge: 2019-03-01 | Disposition: A | Discharge status: Home / Self Care | Payer: Self-pay | Attending: Student | Admitting: Student

## 2019-03-01 ENCOUNTER — Encounter: Payer: Self-pay | Admitting: Emergency Medicine

## 2019-03-01 DIAGNOSIS — Y929 Unspecified place or not applicable: Secondary | ICD-10-CM | POA: Insufficient documentation

## 2019-03-01 DIAGNOSIS — S60561A Insect bite (nonvenomous) of right hand, initial encounter: Secondary | ICD-10-CM

## 2019-03-01 DIAGNOSIS — Z7982 Long term (current) use of aspirin: Secondary | ICD-10-CM | POA: Insufficient documentation

## 2019-03-01 DIAGNOSIS — L03113 Cellulitis of right upper limb: Secondary | ICD-10-CM | POA: Insufficient documentation

## 2019-03-01 DIAGNOSIS — Y939 Activity, unspecified: Secondary | ICD-10-CM | POA: Insufficient documentation

## 2019-03-01 DIAGNOSIS — Y999 Unspecified external cause status: Secondary | ICD-10-CM | POA: Insufficient documentation

## 2019-03-01 DIAGNOSIS — W57XXXA Bitten or stung by nonvenomous insect and other nonvenomous arthropods, initial encounter: Secondary | ICD-10-CM | POA: Insufficient documentation

## 2019-03-01 DIAGNOSIS — F17210 Nicotine dependence, cigarettes, uncomplicated: Secondary | ICD-10-CM | POA: Insufficient documentation

## 2019-03-01 DIAGNOSIS — Z79899 Other long term (current) drug therapy: Secondary | ICD-10-CM | POA: Insufficient documentation

## 2019-03-01 MED ORDER — SULFAMETHOXAZOLE-TRIMETHOPRIM 800-160 MG PO TABS: 1.0000 | ORAL_TABLET | Freq: Two times a day (BID) | ORAL | 0 refills | Status: DC

## 2019-03-01 MED ORDER — CEFTRIAXONE SODIUM 1 G IJ SOLR
1.0000 g | Freq: Once | INTRAMUSCULAR | Status: AC
  Administered 2019-03-01: 1 g via INTRAMUSCULAR
  Filled 2019-03-01: qty 10

## 2019-03-01 MED ORDER — OXYCODONE-ACETAMINOPHEN 5-325 MG PO TABS
1.0000 | ORAL_TABLET | Freq: Once | ORAL | Status: AC
  Administered 2019-03-01: 1 via ORAL
  Filled 2019-03-01: qty 1

## 2019-03-01 MED ORDER — LIDOCAINE HCL (PF) 1 % IJ SOLN
5.0000 mL | Freq: Once | INTRAMUSCULAR | Status: AC
  Administered 2019-03-01: 5 mL via INTRADERMAL
  Filled 2019-03-01: qty 5

## 2019-03-01 MED ORDER — HYDROCODONE-ACETAMINOPHEN 5-325 MG PO TABS: 1.0000 | ORAL_TABLET | Freq: Four times a day (QID) | ORAL | 0 refills | Status: DC | PRN

## 2019-03-01 MED ORDER — CEPHALEXIN 500 MG PO CAPS: 500.0000 mg | ORAL_CAPSULE | Freq: Three times a day (TID) | ORAL | 0 refills | Status: DC

## 2019-03-01 NOTE — ED Triage Notes (Signed)
States some kind of insect bite to R hand 3 days ago, continues open sore and itchy and painful.

## 2019-03-01 NOTE — Discharge Instructions (Signed)
Follow-up with your regular doctor if not better in 3 days.  May also return emergency department.  Use medications as prescribed.  Apply warm compress to the hand.

## 2019-03-01 NOTE — ED Notes (Signed)
See triage note  Presents with possible spider bite to right hand  Couple of days ago  States he did "squeeze" that area yesterday and now area is red and swollen

## 2019-03-01 NOTE — ED Provider Notes (Signed)
[facility] Emergency Department Provider Note  ____________________________________________   First MD Initiated Contact with Patient 03/01/19 1343     (approximate)  I have reviewed the triage vital signs and the nursing notes.   HISTORY  Chief Complaint Insect Bite    HPI [name] is a 53 y.o. male presents emergency department complaint of possible spider bite to the right hand.  States he was camping 2 days ago and felt a sting on his hand.  He slapped it and then has now had a abscess-like lesion on the hand.  Did get a lot of pus out of it yesterday.  Continues to be sore, itchy and painful.  No fever or chills.    Past Medical History:  Diagnosis Date   Hematuria    History of kidney stones    Left ureteral calculus    Nephrolithiasis     There are no active problems to display for this patient.   Past Surgical History:  Procedure Laterality Date   CYSTO/  URETEROSCOPY LASER LITHOTRIPSY WITH STONE EXTRACTION/  STENT PLACEMENT Right 2014   (raliegh)   CYSTOSCOPY WITH URETEROSCOPY AND STENT PLACEMENT Left 12/23/2013   Procedure: LEFT URETEROSCOPY WITH STENT EXCHANGE, STONE EXTRACTION WITH BASKET;  Surgeon: Anner Crete, MD;  Location: [facility];  Service: Urology;  Laterality: Left;   CYSTOSCOPY/RETROGRADE/URETEROSCOPY Left 12/08/2013   Procedure: CYSTOSCOPY/LEFT RETROGRADE PYELOGRAM/ INSERTION LEFT URETERAL STENT;  Surgeon: Anner Crete, MD;  Location: [facility];  Service: Urology;  Laterality: Left;    Prior to Admission medications   Medication Sig Start Date End Date Taking? Authorizing Provider  aspirin 81 MG EC tablet Take 1 tablet (81 mg total) by mouth daily. Swallow whole. 09/20/16   Emily Filbert, MD  cephALEXin (KEFLEX) 500 MG capsule Take 1 capsule (500 mg total) by mouth 3 (three) times daily. 03/01/19   Wildon Cuevas, Roselyn Bering, PA-C  HYDROcodone-acetaminophen (NORCO/VICODIN) 5-325 MG tablet Take 1 tablet  by mouth every 6 (six) hours as needed for moderate pain. 03/01/19   Faythe Ghee, PA-C  Multiple Vitamin (MULTIVITAMIN WITH MINERALS) TABS tablet Take 1 tablet by mouth daily.    [provider]  naproxen sodium (ANAPROX) 220 MG tablet Take 440 mg by mouth 2 (two) times daily as needed (pain).    [provider]  sulfamethoxazole-trimethoprim (BACTRIM DS) 800-160 MG tablet Take 1 tablet by mouth 2 (two) times daily. 03/01/19   Eathen Budreau, Roselyn Bering, PA-C  tamsulosin (FLOMAX) 0.4 MG CAPS capsule Take 1 capsule (0.4 mg total) by mouth daily after supper. 10/27/18   Evon Slack, PA-C  mupirocin nasal ointment (BACTROBAN NASAL) 2 % Place 1 application into the nose 2 (two) times daily. Use one-half of tube in each nostril twice daily for five (5) days. 01/24/18 12/08/18  Joni Reining, PA-C    Allergies Patient has no known allergies.  No family history on file.  Social History Social History   Tobacco Use   Smoking status: Current Every Day Smoker    Packs/day: 0.50    Types: Cigarettes   Smokeless tobacco: Never Used  Substance Use Topics   Alcohol use: Yes   Drug use: No    Review of Systems  Constitutional: No fever/chills Eyes: No visual changes. ENT: No sore throat. Respiratory: Denies cough Genitourinary: Negative for dysuria. Musculoskeletal: Negative for back pain.  Positive for right hand pain Skin: Negative for rash.    ____________________________________________   PHYSICAL  EXAM:  VITAL SIGNS: ED Triage Vitals  Enc Vitals Group     BP 03/01/19 1310 133/80     Pulse Rate 03/01/19 1310 98     Resp 03/01/19 1310 16     Temp 03/01/19 1310 98.9 F (37.2 C)     Temp Source 03/01/19 1310 Oral     SpO2 03/01/19 1310 97 %     Weight 03/01/19 1309 250 lb (113.4 kg)     Height 03/01/19 1309 5\' 8"  (1.727 m)     Head Circumference --      Peak Flow --      Pain Score 03/01/19 1314 8     Pain Loc --      Pain Edu? --      Excl. in GC? --       Constitutional: Alert and oriented. Well appearing and in no acute distress. Eyes: Conjunctivae are normal.  Head: Atraumatic. Nose: No congestion/rhinnorhea. Mouth/Throat: Mucous membranes are moist.   Neck:  supple no lymphadenopathy noted Cardiovascular: Normal rate, regular rhythm. Heart sounds are normal Respiratory: Normal respiratory effort.  No retractions, lungs c t a  GU: deferred Musculoskeletal: FROM all extremities, warm and well perfused, right hand is tender and swollen, pus is noted from a small area.  No redness extending into the fingers.  Neurovascular is intact Neurologic:  Normal speech and language.  Skin:  Skin is warm, dry and intact. No rash noted. Psychiatric: Mood and affect are normal. Speech and behavior are normal.  ____________________________________________   LABS (all labs ordered are listed, but only abnormal results are displayed)  Labs Reviewed - No data to display ____________________________________________   ____________________________________________  RADIOLOGY    ____________________________________________   PROCEDURES  Procedure(s) performed: Rocephin 1 g IM, Percocet 1 p.o.  Procedures    ____________________________________________   INITIAL IMPRESSION / ASSESSMENT AND PLAN / ED COURSE  Pertinent labs & imaging results that were available during my care of the patient were reviewed by me and considered in my medical decision making (see chart for details).   Patient is a 53 year old male presents emergency department with hand pain.  See HPI  Physical exam shows the hand to have a small abscess noted.  Rocephin 1 g IM and 1 Percocet p.o.  Patient is given prescription for Septra and Keflex.  He is to follow-up with his regular doctor if not better in 3 days.  Return emergency department worsening.  States he understands and will comply.  Is discharged stable condition.    [name] was evaluated in  Emergency Department on 03/01/2019 for the symptoms described in the history of present illness. He was evaluated in the context of the global COVID-19 pandemic, which necessitated consideration that the patient might be at risk for infection with the SARS-CoV-2 virus that causes COVID-19. Institutional protocols and algorithms that pertain to the evaluation of patients at risk for COVID-19 are in a state of rapid change based on information released by regulatory bodies including the CDC and federal and state organizations. These policies and algorithms were followed during the patient's care in the ED.   As part of my medical decision making, I reviewed the following data within the electronic MEDICAL RECORD NUMBER Nursing notes reviewed and incorporated, Old chart reviewed, Notes from prior ED visits and  Controlled Substance Database  ____________________________________________   FINAL CLINICAL IMPRESSION(S) / ED DIAGNOSES  Final diagnoses:  Cellulitis of right hand  Insect bite of right hand, initial encounter  NEW MEDICATIONS STARTED DURING THIS VISIT:  Discharge Medication List as of 03/01/2019  2:27 PM    START taking these medications   Details  cephALEXin (KEFLEX) 500 MG capsule Take 1 capsule (500 mg total) by mouth 3 (three) times daily., Starting Sat 03/01/2019, Normal    HYDROcodone-acetaminophen (NORCO/VICODIN) 5-325 MG tablet Take 1 tablet by mouth every 6 (six) hours as needed for moderate pain., Starting Sat 03/01/2019, Normal    sulfamethoxazole-trimethoprim (BACTRIM DS) 800-160 MG tablet Take 1 tablet by mouth 2 (two) times daily., Starting Sat 03/01/2019, Normal         Note:  This document was prepared using Dragon voice recognition software and may include unintentional dictation errors.    Versie Starks, PA-C 03/01/19 1538    Lilia Pro., MD 03/01/19 [phone]

## 2019-05-17 ENCOUNTER — Emergency Department
Admission: EM | Admit: 2019-05-17 | Discharge: 2019-05-17 | Disposition: A | Discharge status: Home / Self Care | Payer: Self-pay | Attending: Student in an Organized Health Care Education/Training Program | Admitting: Student in an Organized Health Care Education/Training Program

## 2019-05-17 ENCOUNTER — Other Ambulatory Visit: Payer: Self-pay

## 2019-05-17 DIAGNOSIS — R110 Nausea: Secondary | ICD-10-CM | POA: Insufficient documentation

## 2019-05-17 DIAGNOSIS — K429 Umbilical hernia without obstruction or gangrene: Secondary | ICD-10-CM | POA: Insufficient documentation

## 2019-05-17 DIAGNOSIS — F17210 Nicotine dependence, cigarettes, uncomplicated: Secondary | ICD-10-CM | POA: Insufficient documentation

## 2019-05-17 DIAGNOSIS — R21 Rash and other nonspecific skin eruption: Secondary | ICD-10-CM | POA: Insufficient documentation

## 2019-05-17 DIAGNOSIS — Z79899 Other long term (current) drug therapy: Secondary | ICD-10-CM | POA: Insufficient documentation

## 2019-05-17 DIAGNOSIS — Z7982 Long term (current) use of aspirin: Secondary | ICD-10-CM | POA: Insufficient documentation

## 2019-05-17 LAB — CBC
HCT: 48.2 % (ref 39.0–52.0)
Hemoglobin: 16.7 g/dL (ref 13.0–17.0)
MCH: 31.2 pg (ref 26.0–34.0)
MCHC: 34.6 g/dL (ref 30.0–36.0)
MCV: 90.1 fL (ref 80.0–100.0)
Platelets: 295 10*3/uL (ref 150–400)
RBC: 5.35 MIL/uL (ref 4.22–5.81)
RDW: 12.7 % (ref 11.5–15.5)
WBC: 11.8 10*3/uL — ABNORMAL HIGH (ref 4.0–10.5)
nRBC: 0 % (ref 0.0–0.2)

## 2019-05-17 LAB — COMPREHENSIVE METABOLIC PANEL
ALT: 30 U/L (ref 0–44)
AST: 27 U/L (ref 15–41)
Albumin: 4 g/dL (ref 3.5–5.0)
Alkaline Phosphatase: 75 U/L (ref 38–126)
Anion gap: 10 (ref 5–15)
BUN: 17 mg/dL (ref 6–20)
CO2: 23 mmol/L (ref 22–32)
Calcium: 8.9 mg/dL (ref 8.9–10.3)
Chloride: 101 mmol/L (ref 98–111)
Creatinine, Ser: 1.12 mg/dL (ref 0.61–1.24)
GFR calc Af Amer: >60 mL/min (ref >60)
GFR calc non Af Amer: >60 mL/min (ref >60)
Glucose, Bld: 193 mg/dL — ABNORMAL HIGH (ref 70–99)
Potassium: 3.6 mmol/L (ref 3.5–5.1)
Sodium: 134 mmol/L — ABNORMAL LOW (ref 135–145)
Total Bilirubin: 1 mg/dL (ref 0.3–1.2)
Total Protein: 7.7 g/dL (ref 6.5–8.1)

## 2019-05-17 LAB — URINALYSIS, COMPLETE (UACMP) WITH MICROSCOPIC
Bacteria, UA: NONE SEEN
Bilirubin Urine: NEGATIVE
Glucose, UA: 50 mg/dL — AB
Hgb urine dipstick: NEGATIVE
Ketones, ur: NEGATIVE mg/dL
Nitrite: NEGATIVE
Protein, ur: NEGATIVE mg/dL
Specific Gravity, Urine: 1.026 (ref 1.005–1.030)
pH: 5 (ref 5.0–8.0)

## 2019-05-17 LAB — LIPASE, BLOOD: Lipase: 33 U/L (ref 11–51)

## 2019-05-17 MED ORDER — POLYETHYLENE GLYCOL 3350 17 G PO PACK: 17.0000 g | PACK | Freq: Every day | ORAL | 0 refills | Status: AC

## 2019-05-17 MED ORDER — CLINDAMYCIN HCL 300 MG PO CAPS: 300.0000 mg | ORAL_CAPSULE | Freq: Three times a day (TID) | ORAL | 0 refills | Status: AC

## 2019-05-17 MED ORDER — NAPROXEN 500 MG PO TABS
500.0000 mg | ORAL_TABLET | Freq: Once | ORAL | Status: AC
  Administered 2019-05-17: 500 mg via ORAL
  Filled 2019-05-17: qty 1

## 2019-05-17 NOTE — ED Notes (Signed)
Patient to stat desk in no acute distress asking about wait time. Patient given update on wait time. Patient verbalizes understanding.  

## 2019-05-17 NOTE — ED Triage Notes (Signed)
Patient reports woke this morning with abdominal pain.  States any movement makes pain worse.

## 2019-05-17 NOTE — ED Notes (Signed)
Pt to front desk to ask about wait time. Pt states that he has been here since 0200 and that he was told 2 hours ago he would be the next to go back.   Pt informed that he is currently the longest wait and should go back next when a bed is open but I am not able to give a wait time because this could changed based on what comes in.

## 2019-05-17 NOTE — ED Provider Notes (Signed)
[facility] Emergency Department Provider Note    First MD Initiated Contact with Patient 05/17/19 [id number]     (approximate)  I have reviewed the triage vital signs and the nursing notes.   HISTORY  Chief Complaint Abdominal Pain    HPI [name] is a 54 y.o. male the below listed past medical history presents the ER for several days of progressively worsening periumbilical pain and swelling.  Is never had pain like this before.  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  Still moving his bowels.  Does have some nausea associated with the pain.  Pain is worse with movement.  No measured fevers.  Also, concerned about rash that he is noticed on his chin and the back of his neck.  Does have a history of boils.  Has not had any purulent drainage.    Past Medical History:  Diagnosis Date  . Hematuria   . History of kidney stones   . Left ureteral calculus   . Nephrolithiasis    No family history on file. Past Surgical History:  Procedure Laterality Date  . CYSTO/  URETEROSCOPY LASER LITHOTRIPSY WITH STONE EXTRACTION/  STENT PLACEMENT Right 2014   (raliegh)  . CYSTOSCOPY WITH URETEROSCOPY AND STENT PLACEMENT Left 12/23/2013   Procedure: LEFT URETEROSCOPY WITH STENT EXCHANGE, STONE EXTRACTION WITH BASKET;  Surgeon: Anner Crete, MD;  Location: [facility];  Service: Urology;  Laterality: Left;  . CYSTOSCOPY/RETROGRADE/URETEROSCOPY Left 12/08/2013   Procedure: CYSTOSCOPY/LEFT RETROGRADE PYELOGRAM/ INSERTION LEFT URETERAL STENT;  Surgeon: Anner Crete, MD;  Location: [facility];  Service: Urology;  Laterality: Left;   There are no problems to display for this patient.     Prior to Admission medications   Medication Sig Start Date End Date Taking? Authorizing Provider  aspirin 81 MG EC tablet Take 1 tablet (81 mg total) by mouth daily. Swallow whole. 09/20/16   Emily Filbert, MD  cephALEXin (KEFLEX) 500 MG capsule Take 1 capsule  (500 mg total) by mouth 3 (three) times daily. 03/01/19   Fisher, Roselyn Bering, PA-C  HYDROcodone-acetaminophen (NORCO/VICODIN) 5-325 MG tablet Take 1 tablet by mouth every 6 (six) hours as needed for moderate pain. 03/01/19   Faythe Ghee, PA-C  Multiple Vitamin (MULTIVITAMIN WITH MINERALS) TABS tablet Take 1 tablet by mouth daily.    [provider]  naproxen sodium (ANAPROX) 220 MG tablet Take 440 mg by mouth 2 (two) times daily as needed (pain).    [provider]  sulfamethoxazole-trimethoprim (BACTRIM DS) 800-160 MG tablet Take 1 tablet by mouth 2 (two) times daily. 03/01/19   Fisher, Roselyn Bering, PA-C  tamsulosin (FLOMAX) 0.4 MG CAPS capsule Take 1 capsule (0.4 mg total) by mouth daily after supper. 10/27/18   Evon Slack, PA-C  mupirocin nasal ointment (BACTROBAN NASAL) 2 % Place 1 application into the nose 2 (two) times daily. Use one-half of tube in each nostril twice daily for five (5) days. 01/24/18 12/08/18  Joni Reining, PA-C    Allergies Patient has no known allergies.    Social History Social History   Tobacco Use  . Smoking status: Current Every Day Smoker    Packs/day: 0.50    Types: Cigarettes  . Smokeless tobacco: Never Used  Substance Use Topics  . Alcohol use: Yes  . Drug use: No    Review of Systems Patient denies headaches, rhinorrhea, blurry vision, numbness, shortness of breath, chest pain, edema, cough, abdominal pain,  nausea, vomiting, diarrhea, dysuria, fevers, rashes or hallucinations unless otherwise stated above in HPI. ____________________________________________   PHYSICAL EXAM:  VITAL SIGNS: Vitals:   05/17/19 0258 05/17/19 0907  BP: 123/90 (!) 132/94  Pulse: 99 87  Resp: 18 18  Temp: 98.1 F (36.7 C) 98.4 F (36.9 C)  SpO2: 98% 97%    Constitutional: Alert and oriented.  Eyes: Conjunctivae are normal.  Head: Atraumatic. Nose: No congestion/rhinnorhea. Mouth/Throat: Mucous membranes are moist.   Neck: No stridor.  Painless ROM.  Cardiovascular: Normal rate, regular rhythm. Grossly normal heart sounds.  Good peripheral circulation. Respiratory: Normal respiratory effort.  No retractions. Lungs CTAB. Gastrointestinal: Soft with periumbilical hernia that is tender but was able to be reduced. No distention. No abdominal bruits. No CVA tenderness. Genitourinary:  Musculoskeletal: No lower extremity tenderness nor edema.  No joint effusions. Neurologic:  Normal speech and language. No gross focal neurologic deficits are appreciated. No facial droop Skin:  Skin is warm, dry and intact. Multiple scab and ulcerative lesion to posterior neck and chin.  No vesicles or crepitus,  No mucosal lesion.Marland Kitchen Psychiatric: Mood and affect are normal. Speech and behavior are normal.  ____________________________________________   LABS (all labs ordered are listed, but only abnormal results are displayed)  Results for orders placed or performed during the hospital encounter of 05/17/19 (from the past 24 hour(s))  Lipase, blood     Status: None   Collection Time: 05/17/19  3:07 AM  Result Value Ref Range   Lipase 33 11 - 51 U/L  Comprehensive metabolic panel     Status: Abnormal   Collection Time: 05/17/19  3:07 AM  Result Value Ref Range   Sodium 134 (L) 135 - 145 mmol/L   Potassium 3.6 3.5 - 5.1 mmol/L   Chloride 101 98 - 111 mmol/L   CO2 23 22 - 32 mmol/L   Glucose, Bld 193 (H) 70 - 99 mg/dL   BUN 17 6 - 20 mg/dL   Creatinine, Ser 9.56 0.61 - 1.24 mg/dL   Calcium 8.9 8.9 - 38.7 mg/dL   Total Protein 7.7 6.5 - 8.1 g/dL   Albumin 4.0 3.5 - 5.0 g/dL   AST 27 15 - 41 U/L   ALT 30 0 - 44 U/L   Alkaline Phosphatase 75 38 - 126 U/L   Total Bilirubin 1.0 0.3 - 1.2 mg/dL   GFR calc non Af Amer >60 >60 mL/min   GFR calc Af Amer >60 >60 mL/min   Anion gap 10 5 - 15  CBC     Status: Abnormal   Collection Time: 05/17/19  3:07 AM  Result Value Ref Range   WBC 11.8 (H) 4.0 - 10.5 K/uL   RBC 5.35 4.22 - 5.81 MIL/uL    Hemoglobin 16.7 13.0 - 17.0 g/dL   HCT 56.4 33.2 - 95.1 %   MCV 90.1 80.0 - 100.0 fL   MCH 31.2 26.0 - 34.0 pg   MCHC 34.6 30.0 - 36.0 g/dL   RDW 88.4 16.6 - 06.3 %   Platelets 295 150 - 400 K/uL   nRBC 0.0 0.0 - 0.2 %  Urinalysis, Complete w Microscopic     Status: Abnormal   Collection Time: 05/17/19  6:10 AM  Result Value Ref Range   Color, Urine YELLOW (A) YELLOW   APPearance CLOUDY (A) CLEAR   Specific Gravity, Urine 1.026 1.005 - 1.030   pH 5.0 5.0 - 8.0   Glucose, UA 50 (A) NEGATIVE mg/dL   Hgb urine  dipstick NEGATIVE NEGATIVE   Bilirubin Urine NEGATIVE NEGATIVE   Ketones, ur NEGATIVE NEGATIVE mg/dL   Protein, ur NEGATIVE NEGATIVE mg/dL   Nitrite NEGATIVE NEGATIVE   Leukocytes,Ua SMALL (A) NEGATIVE   RBC / HPF 11-20 0 - 5 RBC/hpf   WBC, UA 21-50 0 - 5 WBC/hpf   Bacteria, UA NONE SEEN NONE SEEN   Squamous Epithelial / LPF 6-10 0 - 5   Mucus PRESENT    Ca Oxalate Crys, UA PRESENT    ____________________________________________ _____________________________  RADIOLOGY   ____________________________________________   PROCEDURES  Procedure(s) performed:  Procedures    Critical Care performed: no ____________________________________________   INITIAL IMPRESSION / ASSESSMENT AND PLAN / ED COURSE  Pertinent labs & imaging results that were available during my care of the patient were reviewed by me and considered in my medical decision making (see chart for details).   DDX: hernia, appendicitis, diverticulitis, colitis, cellulitis, abscess, foliculitis  [name] is a 54 y.o. who presents to the ED with symptoms as described above.  Patient well-appearing.  Exam consistent with reducible hernia.  Felt significant improvement after hernia was reduced.  Discussed conservative measures and outpatient follow-up.  Also has a history of boils and cellulitis therefore will cover with antibiotics.  No evidence of mucosal lesions.  He is otherwise well-appearing  appropriate for outpatient follow-up  Have discussed with the patient and available family all diagnostics and treatments performed thus far and all questions were answered to the best of my ability. The patient demonstrates understanding and agreement with plan.      The patient was evaluated in Emergency Department today for the symptoms described in the history of present illness. He/she was evaluated in the context of the global COVID-19 pandemic, which necessitated consideration that the patient might be at risk for infection with the SARS-CoV-2 virus that causes COVID-19. Institutional protocols and algorithms that pertain to the evaluation of patients at risk for COVID-19 are in a state of rapid change based on information released by regulatory bodies including the CDC and federal and state organizations. These policies and algorithms were followed during the patient's care in the ED.  As part of my medical decision making, I reviewed the following data within the [place] notes reviewed and incorporated, Labs reviewed, notes from prior ED visits and [place] Controlled Substance Database   ____________________________________________   FINAL CLINICAL IMPRESSION(S) / ED DIAGNOSES  Final diagnoses:  Periumbilical hernia      NEW MEDICATIONS STARTED DURING THIS VISIT:  New Prescriptions   No medications on file     Note:  This document was prepared using Dragon voice recognition software and may include unintentional dictation errors.    Merlyn Lot, MD 05/17/19 [phone]

## 2019-06-24 ENCOUNTER — Emergency Department
Admission: EM | Admit: 2019-06-24 | Discharge: 2019-06-24 | Disposition: A | Discharge status: Home / Self Care | Payer: Self-pay | Attending: Emergency Medicine | Admitting: Emergency Medicine

## 2019-06-24 ENCOUNTER — Encounter: Payer: Self-pay | Admitting: Emergency Medicine

## 2019-06-24 ENCOUNTER — Other Ambulatory Visit: Payer: Self-pay

## 2019-06-24 DIAGNOSIS — Z7982 Long term (current) use of aspirin: Secondary | ICD-10-CM | POA: Insufficient documentation

## 2019-06-24 DIAGNOSIS — F17210 Nicotine dependence, cigarettes, uncomplicated: Secondary | ICD-10-CM | POA: Insufficient documentation

## 2019-06-24 DIAGNOSIS — L0100 Impetigo, unspecified: Secondary | ICD-10-CM | POA: Insufficient documentation

## 2019-06-24 LAB — CBC WITH DIFFERENTIAL/PLATELET
Abs Immature Granulocytes: 0.09 10*3/uL — ABNORMAL HIGH (ref 0.00–0.07)
Basophils Absolute: 0.1 10*3/uL (ref 0.0–0.1)
Basophils Relative: 1 %
Eosinophils Absolute: 0.1 10*3/uL (ref 0.0–0.5)
Eosinophils Relative: 1 %
HCT: 44.8 % (ref 39.0–52.0)
Hemoglobin: 15.4 g/dL (ref 13.0–17.0)
Immature Granulocytes: 1 %
Lymphocytes Relative: 30 %
Lymphs Abs: 4.3 10*3/uL — ABNORMAL HIGH (ref 0.7–4.0)
MCH: 31 pg (ref 26.0–34.0)
MCHC: 34.4 g/dL (ref 30.0–36.0)
MCV: 90.3 fL (ref 80.0–100.0)
Monocytes Absolute: 1.4 10*3/uL — ABNORMAL HIGH (ref 0.1–1.0)
Monocytes Relative: 10 %
Neutro Abs: 8.3 10*3/uL — ABNORMAL HIGH (ref 1.7–7.7)
Neutrophils Relative %: 57 %
Platelets: 259 10*3/uL (ref 150–400)
RBC: 4.96 MIL/uL (ref 4.22–5.81)
RDW: 12.6 % (ref 11.5–15.5)
WBC: 14.3 10*3/uL — ABNORMAL HIGH (ref 4.0–10.5)
nRBC: 0 % (ref 0.0–0.2)

## 2019-06-24 LAB — BASIC METABOLIC PANEL
Anion gap: 12 (ref 5–15)
BUN: 17 mg/dL (ref 6–20)
CO2: 21 mmol/L — ABNORMAL LOW (ref 22–32)
Calcium: 8.4 mg/dL — ABNORMAL LOW (ref 8.9–10.3)
Chloride: 103 mmol/L (ref 98–111)
Creatinine, Ser: 1.23 mg/dL (ref 0.61–1.24)
GFR calc Af Amer: >60 mL/min (ref >60)
GFR calc non Af Amer: >60 mL/min (ref >60)
Glucose, Bld: 303 mg/dL — ABNORMAL HIGH (ref 70–99)
Potassium: 3.6 mmol/L (ref 3.5–5.1)
Sodium: 136 mmol/L (ref 135–145)

## 2019-06-24 MED ORDER — METHYLPREDNISOLONE 4 MG PO TBPK: ORAL_TABLET | ORAL | 0 refills | Status: DC

## 2019-06-24 MED ORDER — HYDROXYZINE HCL 50 MG PO TABS: 50.0000 mg | ORAL_TABLET | Freq: Three times a day (TID) | ORAL | 0 refills | Status: AC | PRN

## 2019-06-24 MED ORDER — METHYLPREDNISOLONE SODIUM SUCC 125 MG IJ SOLR
125.0000 mg | Freq: Once | INTRAMUSCULAR | Status: AC
  Administered 2019-06-24: 13:00:00 125 mg via INTRAVENOUS

## 2019-06-24 MED ORDER — HYDROXYZINE HCL 50 MG PO TABS
50.0000 mg | ORAL_TABLET | Freq: Once | ORAL | Status: AC
  Administered 2019-06-24: 50 mg via ORAL
  Filled 2019-06-24: qty 1

## 2019-06-24 MED ORDER — CLINDAMYCIN PHOSPHATE 600 MG/50ML IV SOLN
600.0000 mg | Freq: Once | INTRAVENOUS | Status: AC
  Administered 2019-06-24: 13:00:00 600 mg via INTRAVENOUS
  Filled 2019-06-24: qty 50

## 2019-06-24 MED ORDER — CLINDAMYCIN HCL 300 MG PO CAPS: 300.0000 mg | ORAL_CAPSULE | Freq: Three times a day (TID) | ORAL | 0 refills | Status: DC

## 2019-06-24 MED ORDER — METHYLPREDNISOLONE SODIUM SUCC 125 MG IJ SOLR
125.0000 mg | Freq: Once | INTRAMUSCULAR | Status: DC
  Filled 2019-06-24: qty 2

## 2019-06-24 MED ORDER — LIDOCAINE VISCOUS HCL 2 % MT SOLN
15.0000 mL | Freq: Once | OROMUCOSAL | Status: AC
  Administered 2019-06-24: 13:00:00 15 mL via OROMUCOSAL
  Filled 2019-06-24: qty 15

## 2019-06-24 NOTE — ED Provider Notes (Signed)
[facility] Emergency Department Provider Note   ____________________________________________   First MD Initiated Contact with Patient 06/24/19 1247     (approximate)  I have reviewed the triage vital signs and the nursing notes.   HISTORY  Chief Complaint Rash    HPI [name] is a 54 y.o. male patient present with generalized rash for approximately 2 weeks.  Patient the rash started as multiple vesicular lesions after yard work.  Patient states lesions are itchy.  Denies fevers or chills at this time.         Past Medical History:  Diagnosis Date  . Hematuria   . History of kidney stones   . Left ureteral calculus   . Nephrolithiasis     There are no problems to display for this patient.   Past Surgical History:  Procedure Laterality Date  . CYSTO/  URETEROSCOPY LASER LITHOTRIPSY WITH STONE EXTRACTION/  STENT PLACEMENT Right 2014   (raliegh)  . CYSTOSCOPY WITH URETEROSCOPY AND STENT PLACEMENT Left 12/23/2013   Procedure: LEFT URETEROSCOPY WITH STENT EXCHANGE, STONE EXTRACTION WITH BASKET;  Surgeon: Malka So, MD;  Location: [facility];  Service: Urology;  Laterality: Left;  . CYSTOSCOPY/RETROGRADE/URETEROSCOPY Left 12/08/2013   Procedure: CYSTOSCOPY/LEFT RETROGRADE PYELOGRAM/ INSERTION LEFT URETERAL STENT;  Surgeon: Malka So, MD;  Location: [facility];  Service: Urology;  Laterality: Left;    Prior to Admission medications   Medication Sig Start Date End Date Taking? Authorizing Provider  aspirin 81 MG EC tablet Take 1 tablet (81 mg total) by mouth daily. Swallow whole. 09/20/16   Earleen Newport, MD  cephALEXin (KEFLEX) 500 MG capsule Take 1 capsule (500 mg total) by mouth 3 (three) times daily. 03/01/19   Fisher, Linden Dolin, PA-C  clindamycin (CLEOCIN) 300 MG capsule Take 1 capsule (300 mg total) by mouth 3 (three) times daily for 10 days. 06/24/19 07/04/19  Sable Feil, PA-C  HYDROcodone-acetaminophen  (NORCO/VICODIN) 5-325 MG tablet Take 1 tablet by mouth every 6 (six) hours as needed for moderate pain. 03/01/19   Fisher, Linden Dolin, PA-C  hydrOXYzine (ATARAX/VISTARIL) 50 MG tablet Take 1 tablet (50 mg total) by mouth 3 (three) times daily as needed. 06/24/19   Sable Feil, PA-C  methylPREDNISolone (MEDROL DOSEPAK) 4 MG TBPK tablet Take Tapered dose as directed 06/24/19   Sable Feil, PA-C  Multiple Vitamin (MULTIVITAMIN WITH MINERALS) TABS tablet Take 1 tablet by mouth daily.    [provider]  naproxen sodium (ANAPROX) 220 MG tablet Take 440 mg by mouth 2 (two) times daily as needed (pain).    [provider]  polyethylene glycol (MIRALAX / GLYCOLAX) 17 g packet Take 17 g by mouth daily. Mix one tablespoon with 8oz of your favorite juice or water every day until you are having soft formed stools. Then start taking once daily if you didn't have a stool the day before. 05/17/19   Merlyn Lot, MD  sulfamethoxazole-trimethoprim (BACTRIM DS) 800-160 MG tablet Take 1 tablet by mouth 2 (two) times daily. 03/01/19   Fisher, Linden Dolin, PA-C  tamsulosin (FLOMAX) 0.4 MG CAPS capsule Take 1 capsule (0.4 mg total) by mouth daily after supper. 10/27/18   Duanne Guess, PA-C  mupirocin nasal ointment (BACTROBAN NASAL) 2 % Place 1 application into the nose 2 (two) times daily. Use one-half of tube in each nostril twice daily for five (5) days. 01/24/18 12/08/18  Sable Feil, PA-C    Allergies Patient has  no known allergies.  No family history on file.  Social History Social History   Tobacco Use  . Smoking status: Current Every Day Smoker    Packs/day: 0.50    Types: Cigarettes  . Smokeless tobacco: Never Used  Substance Use Topics  . Alcohol use: Yes  . Drug use: No    Review of Systems Constitutional: No fever/chills Eyes: No visual changes. ENT: No sore throat. Cardiovascular: Denies chest pain. Respiratory: Denies shortness of breath. Gastrointestinal: No  abdominal pain.  No nausea, no vomiting.  No diarrhea.  No constipation. Genitourinary: Negative for dysuria. Musculoskeletal: Negative for back pain. Skin: Positive for rash. Neurological: Negative for headaches, focal weakness or numbness.   ____________________________________________   PHYSICAL EXAM:  VITAL SIGNS: ED Triage Vitals [06/24/19 1230]  Enc Vitals Group     BP      Pulse      Resp      Temp      Temp src      SpO2      Weight 250 lb (113.4 kg)     Height      Head Circumference      Peak Flow      Pain Score      Pain Loc      Pain Edu?      Excl. in GC?    Constitutional: Alert and oriented. Well appearing and in no acute distress. Cardiovascular: Normal rate, regular rhythm. Grossly normal heart sounds.  Good peripheral circulation. Respiratory: Normal respiratory effort.  No retractions. Lungs CTAB. Skin:  Skin is warm, dry and intact.  Multiple dry honey crusted lesions. Psychiatric: Mood and affect are normal. Speech and behavior are normal.  ____________________________________________   LABS (all labs ordered are listed, but only abnormal results are displayed)  Labs Reviewed  BASIC METABOLIC PANEL - Abnormal; Notable for the following components:      Result Value   CO2 21 (*)    Glucose, Bld 303 (*)    Calcium 8.4 (*)    All other components within normal limits  CBC WITH DIFFERENTIAL/PLATELET - Abnormal; Notable for the following components:   WBC 14.3 (*)    Neutro Abs 8.3 (*)    Lymphs Abs 4.3 (*)    Monocytes Absolute 1.4 (*)    Abs Immature Granulocytes 0.09 (*)    All other components within normal limits   ____________________________________________  EKG   ____________________________________________  RADIOLOGY  ED MD interpretation:    Official radiology report(s): No results found.  ____________________________________________   PROCEDURES  Procedure(s) performed (including Critical  Care):  Procedures   ____________________________________________   INITIAL IMPRESSION / ASSESSMENT AND PLAN / ED COURSE  As part of my medical decision making, I reviewed the following data within the electronic MEDICAL RECORD NUMBER      Patient presents with diffuse honey crusted dried lesions for approximately 10 to 12 days.  Physical exam is consistent with dog impetigo.  Patient started on IV antibiotics and switch to oral medication.  Patient advised to follow discharge care instruction take medication as directed.  Patient advised [facility] with [facility].    LIZZIE COKLEY was evaluated in Emergency Department on 06/24/2019 for the symptoms described in the history of present illness. He was evaluated in the context of the global COVID-19 pandemic, which necessitated consideration that the patient might be at risk for infection with the SARS-CoV-2 virus that causes COVID-19. Institutional protocols and algorithms that pertain to the  evaluation of patients at risk for COVID-19 are in a state of rapid change based on information released by regulatory bodies including the CDC and federal and state organizations. These policies and algorithms were followed during the patient's care in the ED.       ____________________________________________   FINAL CLINICAL IMPRESSION(S) / ED DIAGNOSES  Final diagnoses:  Impetigo     ED Discharge Orders         Ordered    clindamycin (CLEOCIN) 300 MG capsule  3 times daily     06/24/19 1404    methylPREDNISolone (MEDROL DOSEPAK) 4 MG TBPK tablet     06/24/19 1404    hydrOXYzine (ATARAX/VISTARIL) 50 MG tablet  3 times daily PRN     06/24/19 1404           Note:  This document was prepared using Dragon voice recognition software and may include unintentional dictation errors.    Joni Reining, PA-C 06/24/19 1409    Emily Filbert, MD 06/24/19 [phone]

## 2019-06-24 NOTE — Discharge Instructions (Addendum)
Follow discharge care instruction take medication as directed. °

## 2019-06-24 NOTE — ED Triage Notes (Signed)
Presents with generalized rash

## 2019-06-29 ENCOUNTER — Other Ambulatory Visit: Payer: Self-pay

## 2019-06-29 ENCOUNTER — Emergency Department
Admission: EM | Admit: 2019-06-29 | Discharge: 2019-06-29 | Disposition: A | Discharge status: Home / Self Care | Payer: Self-pay | Attending: Emergency Medicine | Admitting: Emergency Medicine

## 2019-06-29 DIAGNOSIS — L0291 Cutaneous abscess, unspecified: Secondary | ICD-10-CM

## 2019-06-29 DIAGNOSIS — B958 Unspecified staphylococcus as the cause of diseases classified elsewhere: Secondary | ICD-10-CM | POA: Insufficient documentation

## 2019-06-29 DIAGNOSIS — F17210 Nicotine dependence, cigarettes, uncomplicated: Secondary | ICD-10-CM | POA: Insufficient documentation

## 2019-06-29 DIAGNOSIS — Z7982 Long term (current) use of aspirin: Secondary | ICD-10-CM | POA: Insufficient documentation

## 2019-06-29 DIAGNOSIS — L089 Local infection of the skin and subcutaneous tissue, unspecified: Secondary | ICD-10-CM | POA: Insufficient documentation

## 2019-06-29 LAB — COMPREHENSIVE METABOLIC PANEL
ALT: 28 U/L (ref 0–44)
AST: 20 U/L (ref 15–41)
Albumin: 3.5 g/dL (ref 3.5–5.0)
Alkaline Phosphatase: 63 U/L (ref 38–126)
Anion gap: 7 (ref 5–15)
BUN: 18 mg/dL (ref 6–20)
CO2: 24 mmol/L (ref 22–32)
Calcium: 9 mg/dL (ref 8.9–10.3)
Chloride: 106 mmol/L (ref 98–111)
Creatinine, Ser: 0.98 mg/dL (ref 0.61–1.24)
GFR calc Af Amer: >60 mL/min (ref >60)
GFR calc non Af Amer: >60 mL/min (ref >60)
Glucose, Bld: 316 mg/dL — ABNORMAL HIGH (ref 70–99)
Potassium: 4.2 mmol/L (ref 3.5–5.1)
Sodium: 137 mmol/L (ref 135–145)
Total Bilirubin: 0.8 mg/dL (ref 0.3–1.2)
Total Protein: 6.9 g/dL (ref 6.5–8.1)

## 2019-06-29 LAB — CBC WITH DIFFERENTIAL/PLATELET
Abs Immature Granulocytes: 0.21 10*3/uL — ABNORMAL HIGH (ref 0.00–0.07)
Basophils Absolute: 0.1 10*3/uL (ref 0.0–0.1)
Basophils Relative: 0 %
Eosinophils Absolute: 0.1 10*3/uL (ref 0.0–0.5)
Eosinophils Relative: 0 %
HCT: 44.3 % (ref 39.0–52.0)
Hemoglobin: 15.6 g/dL (ref 13.0–17.0)
Immature Granulocytes: 1 %
Lymphocytes Relative: 10 %
Lymphs Abs: 1.8 10*3/uL (ref 0.7–4.0)
MCH: 31.4 pg (ref 26.0–34.0)
MCHC: 35.2 g/dL (ref 30.0–36.0)
MCV: 89.1 fL (ref 80.0–100.0)
Monocytes Absolute: 1.5 10*3/uL — ABNORMAL HIGH (ref 0.1–1.0)
Monocytes Relative: 8 %
Neutro Abs: 14.7 10*3/uL — ABNORMAL HIGH (ref 1.7–7.7)
Neutrophils Relative %: 81 %
Platelets: 259 10*3/uL (ref 150–400)
RBC: 4.97 MIL/uL (ref 4.22–5.81)
RDW: 12.8 % (ref 11.5–15.5)
WBC: 18.3 10*3/uL — ABNORMAL HIGH (ref 4.0–10.5)
nRBC: 0 % (ref 0.0–0.2)

## 2019-06-29 MED ORDER — OXYCODONE-ACETAMINOPHEN 5-325 MG PO TABS: 1.0000 | ORAL_TABLET | Freq: Three times a day (TID) | ORAL | 0 refills | Status: AC | PRN

## 2019-06-29 MED ORDER — LIDOCAINE HCL (PF) 1 % IJ SOLN
5.0000 mL | Freq: Once | INTRAMUSCULAR | Status: AC
  Administered 2019-06-29: 5 mL via INTRADERMAL
  Filled 2019-06-29: qty 5

## 2019-06-29 NOTE — ED Notes (Signed)
Pt verbalized understanding of discharge instructions. NAD at this time. 

## 2019-06-29 NOTE — ED Triage Notes (Signed)
FIRST NURSE NOTE:  Pt reports he was seen 5 days ago for rash, dx with impetigo, reports sxs are "no better"

## 2019-06-29 NOTE — ED Triage Notes (Signed)
Pt comes with rash/abscess on left side of mouth. Pt also has a new boil on his buttocks. Pt has taken meds rx last week but has not helped.

## 2019-06-29 NOTE — Discharge Instructions (Signed)
Please continue taking your antibiotic prescription.  Please follow-up with this department or the wound center in 2 days for recheck.  Please call the wound center tomorrow for a follow-up appointment this week.

## 2019-06-29 NOTE — ED Notes (Signed)
Pt recently seen and dx with impetigo. Pt states prescriptions helped but with little relief. Pt has multiple lesions all over his body.

## 2019-06-29 NOTE — ED Provider Notes (Signed)
[facility] Emergency Department Provider Note  ____________________________________________  Time seen: Approximately 4:30 PM  I have reviewed the triage vital signs and the nursing notes.   HISTORY  Chief Complaint Rash    HPI [name] is a 54 y.o. male that presents to the emergency department for evaluation of rash for 3 weeks.  Patient was seen in this emergency department last week and was diagnosed with impetigo.  He was given IV antibiotics while here and discharged home with oral clindamycin and steroids.  He still has 1 day left of the steroids and about 3 days left of the clindamycin.  Patient states that the rash overall has improved. His neck and his buttocks has significantly improved.  He has developed now what feels like a boil to the side of his mouth and his left buttocks, however.  He has been itching at the lesions and are causing them to be raw.  No fevers.  Overall he feels well.  He has a history of a staph infection.  No nausea, vomiting, abdominal pain.  Past Medical History:  Diagnosis Date  . Hematuria   . History of kidney stones   . Left ureteral calculus   . Nephrolithiasis     There are no problems to display for this patient.   Past Surgical History:  Procedure Laterality Date  . CYSTO/  URETEROSCOPY LASER LITHOTRIPSY WITH STONE EXTRACTION/  STENT PLACEMENT Right 2014   (raliegh)  . CYSTOSCOPY WITH URETEROSCOPY AND STENT PLACEMENT Left 12/23/2013   Procedure: LEFT URETEROSCOPY WITH STENT EXCHANGE, STONE EXTRACTION WITH BASKET;  Surgeon: Malka So, MD;  Location: [facility];  Service: Urology;  Laterality: Left;  . CYSTOSCOPY/RETROGRADE/URETEROSCOPY Left 12/08/2013   Procedure: CYSTOSCOPY/LEFT RETROGRADE PYELOGRAM/ INSERTION LEFT URETERAL STENT;  Surgeon: Malka So, MD;  Location: [facility];  Service: Urology;  Laterality: Left;    Prior to Admission medications   Medication Sig Start Date End Date  Taking? Authorizing Provider  aspirin 81 MG EC tablet Take 1 tablet (81 mg total) by mouth daily. Swallow whole. 09/20/16   Earleen Newport, MD  cephALEXin (KEFLEX) 500 MG capsule Take 1 capsule (500 mg total) by mouth 3 (three) times daily. 03/01/19   Fisher, Linden Dolin, PA-C  clindamycin (CLEOCIN) 300 MG capsule Take 1 capsule (300 mg total) by mouth 3 (three) times daily for 10 days. 06/24/19 07/04/19  Sable Feil, PA-C  HYDROcodone-acetaminophen (NORCO/VICODIN) 5-325 MG tablet Take 1 tablet by mouth every 6 (six) hours as needed for moderate pain. 03/01/19   Fisher, Linden Dolin, PA-C  hydrOXYzine (ATARAX/VISTARIL) 50 MG tablet Take 1 tablet (50 mg total) by mouth 3 (three) times daily as needed. 06/24/19   Sable Feil, PA-C  methylPREDNISolone (MEDROL DOSEPAK) 4 MG TBPK tablet Take Tapered dose as directed 06/24/19   Sable Feil, PA-C  Multiple Vitamin (MULTIVITAMIN WITH MINERALS) TABS tablet Take 1 tablet by mouth daily.    [provider]  naproxen sodium (ANAPROX) 220 MG tablet Take 440 mg by mouth 2 (two) times daily as needed (pain).    [provider]  oxyCODONE-acetaminophen (PERCOCET) 5-325 MG tablet Take 1 tablet by mouth every 8 (eight) hours as needed for up to 2 days for severe pain. 06/29/19 07/01/19  Laban Emperor, PA-C  polyethylene glycol (MIRALAX / GLYCOLAX) 17 g packet Take 17 g by mouth daily. Mix one tablespoon with 8oz of your favorite juice or water every day until you are  having soft formed stools. Then start taking once daily if you didn't have a stool the day before. 05/17/19   Willy Eddy, MD  sulfamethoxazole-trimethoprim (BACTRIM DS) 800-160 MG tablet Take 1 tablet by mouth 2 (two) times daily. 03/01/19   Fisher, Roselyn Bering, PA-C  tamsulosin (FLOMAX) 0.4 MG CAPS capsule Take 1 capsule (0.4 mg total) by mouth daily after supper. 10/27/18   Evon Slack, PA-C  mupirocin nasal ointment (BACTROBAN NASAL) 2 % Place 1 application into the nose 2 (two)  times daily. Use one-half of tube in each nostril twice daily for five (5) days. 01/24/18 12/08/18  Joni Reining, PA-C    Allergies Patient has no known allergies.  History reviewed. No pertinent family history.  Social History Social History   Tobacco Use  . Smoking status: Current Every Day Smoker    Packs/day: 0.50    Types: Cigarettes  . Smokeless tobacco: Never Used  Substance Use Topics  . Alcohol use: Yes  . Drug use: No     Review of Systems  Constitutional: No fever/chills Cardiovascular: No chest pain. Respiratory: No SOB. Gastrointestinal: No abdominal pain.  No nausea, no vomiting.  Musculoskeletal: Negative for musculoskeletal pain. Skin: Negative for abrasions, lacerations, ecchymosis.  Positive for rash. Neurological: Negative for headaches, numbness or tingling   ____________________________________________   PHYSICAL EXAM:  VITAL SIGNS: ED Triage Vitals  Enc Vitals Group     BP 06/29/19 1506 (!) 149/85     Pulse Rate 06/29/19 1506 95     Resp 06/29/19 1506 16     Temp 06/29/19 1506 98.9 F (37.2 C)     Temp Source 06/29/19 1506 Oral     SpO2 06/29/19 1506 97 %     Weight 06/29/19 1504 249 lb 1.9 oz (113 kg)     Height 06/29/19 1504 5\' 8"  (1.727 m)     Head Circumference --      Peak Flow --      Pain Score 06/29/19 1504 10     Pain Loc --      Pain Edu? --      Excl. in GC? --      Constitutional: Alert and oriented. Well appearing and in no acute distress. Eyes: Conjunctivae are normal. PERRL. EOMI. Head: Atraumatic. ENT:      Ears:      Nose: No congestion/rhinnorhea.      Mouth/Throat: Mucous membranes are moist.  Neck: No stridor.  Cardiovascular: Normal rate, regular rhythm.  Good peripheral circulation. Respiratory: Normal respiratory effort without tachypnea or retractions. Lungs CTAB. Good air entry to the bases with no decreased or absent breath sounds. Musculoskeletal: Full range of motion to all extremities. No gross  deformities appreciated. Neurologic:  Normal speech and language. No gross focal neurologic deficits are appreciated.  Skin:  Skin is warm, dry and intact.  Scattered pink papules throughout body.  1 inch x 1 inch area of erythema, induration, fluctuance to left mid buttocks.  1 cm x 1 cm area of induration with overlying skin defect to proximal to right corner of mouth. Psychiatric: Mood and affect are normal. Speech and behavior are normal. Patient exhibits appropriate insight and judgement.   ____________________________________________   LABS (all labs ordered are listed, but only abnormal results are displayed)  Labs Reviewed  CBC WITH DIFFERENTIAL/PLATELET - Abnormal; Notable for the following components:      Result Value   WBC 18.3 (*)    Neutro Abs 14.7 (*)  Monocytes Absolute 1.5 (*)    Abs Immature Granulocytes 0.21 (*)    All other components within normal limits  COMPREHENSIVE METABOLIC PANEL - Abnormal; Notable for the following components:   Glucose, Bld 316 (*)    All other components within normal limits   ____________________________________________  EKG   ____________________________________________  RADIOLOGY   No results found.  ____________________________________________    PROCEDURES  Procedure(s) performed:    Procedures  INCISION AND DRAINAGE Performed by: Enid Derry Consent: Verbal consent obtained. Risks and benefits: risks, benefits and alternatives were discussed Type: abscess  Body area: buttocks  Anesthesia: local infiltration  Incision was made with a scalpel.  Local anesthetic: lidocaine 1 % without epinephrine  Anesthetic total: 3 ml  Complexity: complex Blunt dissection to break up loculations  Drainage: purulent  Drainage amount: moderate  Packing material: 1/4 in iodoform gauze  Patient tolerance: Patient tolerated the procedure well with no immediate complications.  INCISION AND DRAINAGE Performed  by: Enid Derry Consent: Verbal consent obtained. Risks and benefits: risks, benefits and alternatives were discussed Type: abscess  Body area: chin  Anesthesia: local infiltration  Incision was made with a scalpel.  Local anesthetic: lidocaine 1% without epinephrine  Anesthetic total: 1  ml  Drainage: none  Packing material: none  Patient tolerance: Patient tolerated the procedure well with no immediate complications.       Medications  lidocaine (PF) (XYLOCAINE) 1 % injection 5 mL (5 mLs Intradermal Given 06/29/19 1810)     ____________________________________________   INITIAL IMPRESSION / ASSESSMENT AND PLAN / ED COURSE  Pertinent labs & imaging results that were available during my care of the patient were reviewed by me and considered in my medical decision making (see chart for details).  Review of the [place] CSRS was performed in accordance of the [facility] prior to dispensing any controlled drugs.   Patient presented to the emergency department for reevaluation of rash and possible abscess.  Vital signs and exam are reassuring.  WBC increased to 18.3, which is likely since patient has been on steroids.  Abscess to left buttocks was drained with success of purulent drainage.  Attempted incision and drainage to possible abscess to face did not produce any drainage.  Patient will return the emergency department for recheck in 2 days.  He will continue his clindamycin.  Patient is to follow up with primary care as directed. Patient is given ED precautions to return to the ED for any worsening or new symptoms.   [name] was evaluated in Emergency Department on 06/29/2019 for the symptoms described in the history of present illness. He was evaluated in the context of the global COVID-19 pandemic, which necessitated consideration that the patient might be at risk for infection with the SARS-CoV-2 virus that causes COVID-19. Institutional protocols and algorithms that  pertain to the evaluation of patients at risk for COVID-19 are in a state of rapid change based on information released by regulatory bodies including the CDC and federal and state organizations. These policies and algorithms were followed during the patient's care in the ED.  ____________________________________________  FINAL CLINICAL IMPRESSION(S) / ED DIAGNOSES  Final diagnoses:  Staph skin infection  Abscess      NEW MEDICATIONS STARTED DURING THIS VISIT:  ED Discharge Orders         Ordered    oxyCODONE-acetaminophen (PERCOCET) 5-325 MG tablet  Every 8 hours PRN     06/29/19 1758  This chart was dictated using voice recognition software/Dragon. Despite best efforts to proofread, errors can occur which can change the meaning. Any change was purely unintentional.    Enid Derry, PA-C 06/29/19 2356    Jene Every, MD 06/30/19 1537

## 2019-06-30 ENCOUNTER — Encounter: Payer: Self-pay | Admitting: *Deleted

## 2019-06-30 ENCOUNTER — Emergency Department
Admission: EM | Admit: 2019-06-30 | Discharge: 2019-06-30 | Disposition: A | Discharge status: Home / Self Care | Payer: Self-pay | Attending: Emergency Medicine | Admitting: Emergency Medicine

## 2019-06-30 ENCOUNTER — Other Ambulatory Visit: Payer: Self-pay

## 2019-06-30 ENCOUNTER — Emergency Department: Payer: Self-pay

## 2019-06-30 DIAGNOSIS — R519 Headache, unspecified: Secondary | ICD-10-CM | POA: Insufficient documentation

## 2019-06-30 DIAGNOSIS — R000 Tachycardia, unspecified: Secondary | ICD-10-CM | POA: Insufficient documentation

## 2019-06-30 DIAGNOSIS — L03211 Cellulitis of face: Secondary | ICD-10-CM | POA: Insufficient documentation

## 2019-06-30 LAB — CBC WITH DIFFERENTIAL/PLATELET
Abs Immature Granulocytes: 0.22 10*3/uL — ABNORMAL HIGH (ref 0.00–0.07)
Basophils Absolute: 0.1 10*3/uL (ref 0.0–0.1)
Basophils Relative: 1 %
Eosinophils Absolute: 0.2 10*3/uL (ref 0.0–0.5)
Eosinophils Relative: 1 %
HCT: 48.1 % (ref 39.0–52.0)
Hemoglobin: 16.6 g/dL (ref 13.0–17.0)
Immature Granulocytes: 1 %
Lymphocytes Relative: 11 %
Lymphs Abs: 2.4 10*3/uL (ref 0.7–4.0)
MCH: 31 pg (ref 26.0–34.0)
MCHC: 34.5 g/dL (ref 30.0–36.0)
MCV: 89.9 fL (ref 80.0–100.0)
Monocytes Absolute: 1.7 10*3/uL — ABNORMAL HIGH (ref 0.1–1.0)
Monocytes Relative: 8 %
Neutro Abs: 16.9 10*3/uL — ABNORMAL HIGH (ref 1.7–7.7)
Neutrophils Relative %: 78 %
Platelets: 273 10*3/uL (ref 150–400)
RBC: 5.35 MIL/uL (ref 4.22–5.81)
RDW: 12.8 % (ref 11.5–15.5)
WBC: 21.4 10*3/uL — ABNORMAL HIGH (ref 4.0–10.5)
nRBC: 0 % (ref 0.0–0.2)

## 2019-06-30 LAB — COMPREHENSIVE METABOLIC PANEL
ALT: 28 U/L (ref 0–44)
AST: 19 U/L (ref 15–41)
Albumin: 3.8 g/dL (ref 3.5–5.0)
Alkaline Phosphatase: 79 U/L (ref 38–126)
Anion gap: 8 (ref 5–15)
BUN: 13 mg/dL (ref 6–20)
CO2: 28 mmol/L (ref 22–32)
Calcium: 9 mg/dL (ref 8.9–10.3)
Chloride: 99 mmol/L (ref 98–111)
Creatinine, Ser: 1.08 mg/dL (ref 0.61–1.24)
GFR calc Af Amer: >60 mL/min (ref >60)
GFR calc non Af Amer: >60 mL/min (ref >60)
Glucose, Bld: 258 mg/dL — ABNORMAL HIGH (ref 70–99)
Potassium: 3.9 mmol/L (ref 3.5–5.1)
Sodium: 135 mmol/L (ref 135–145)
Total Bilirubin: 1.3 mg/dL — ABNORMAL HIGH (ref 0.3–1.2)
Total Protein: 7.7 g/dL (ref 6.5–8.1)

## 2019-06-30 LAB — LACTIC ACID, PLASMA: Lactic Acid, Venous: 1.3 mmol/L (ref 0.5–1.9)

## 2019-06-30 IMAGING — CT CT MAXILLOFACIAL W/ CM
3 of 4 series · 14 of 47 positions shown, 16 images · IV contrast (omnipaque)
Comparison: No pertinent prior studies available for comparison.

CLINICAL DATA: Mass, lump or swelling, maxface. Additional history
provided: Patient reports right-sided face/jaw pain for 1 week,
unable to chew food, difficulty drinking due to inability to fully
close mouth.

EXAM:
CT MAXILLOFACIAL WITH CONTRAST
TECHNIQUE: Multidetector CT imaging of the maxillofacial structures was
performed with intravenous contrast. Multiplanar CT image
reconstructions were also generated.
CONTRAST:  75mL OMNIPAQUE IOHEXOL 300 MG/ML  SOLN

[Series 2: max soft · axial · 0.40mm/px · z∈[-271,-133]mm · 8 of 85 slices shown, 10 images]
[im 8/85  brain]
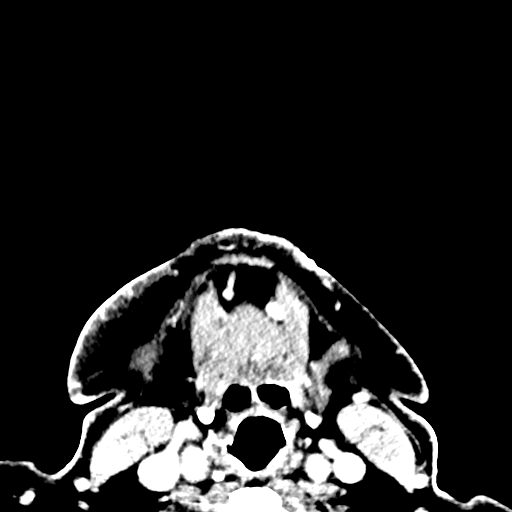
[im 8/85  bone]
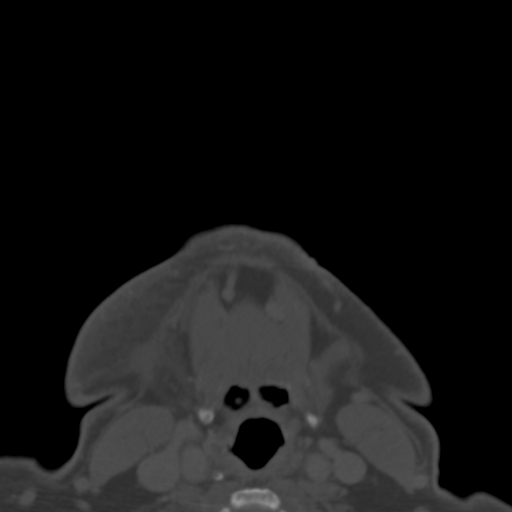
[im 16/85  bone]
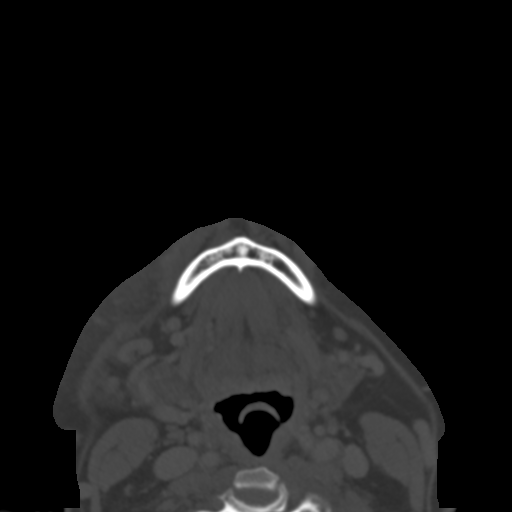
[im 27/85  bone]
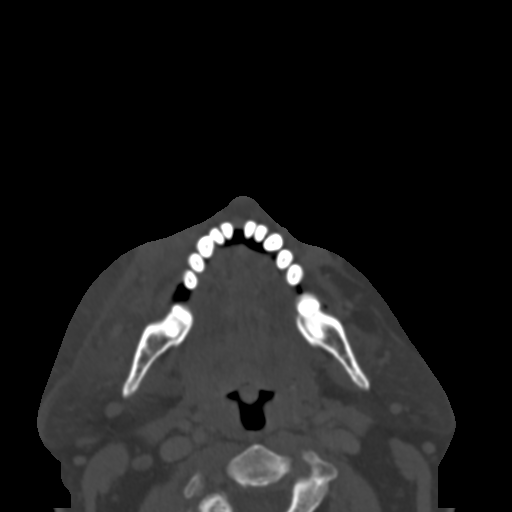
[im 39/85  bone]
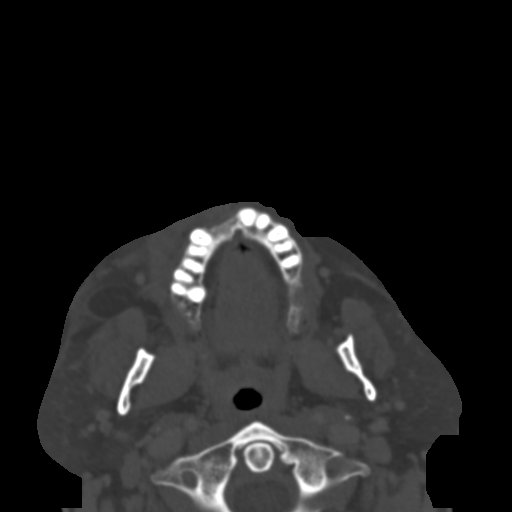
[im 46/85  brain]
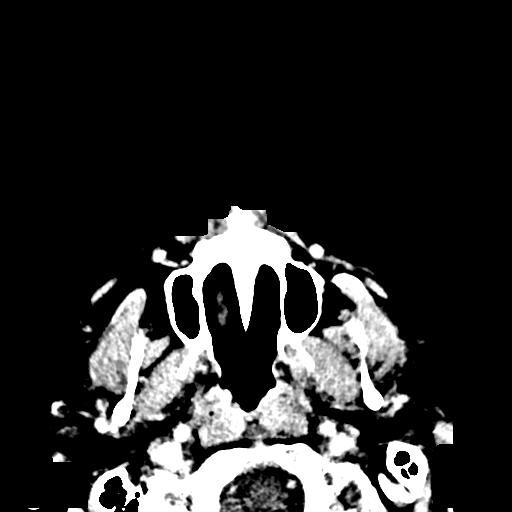
[im 46/85  bone]
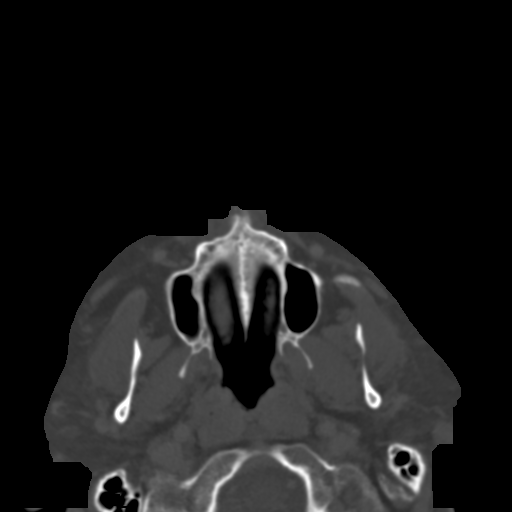
[im 58/85  bone]
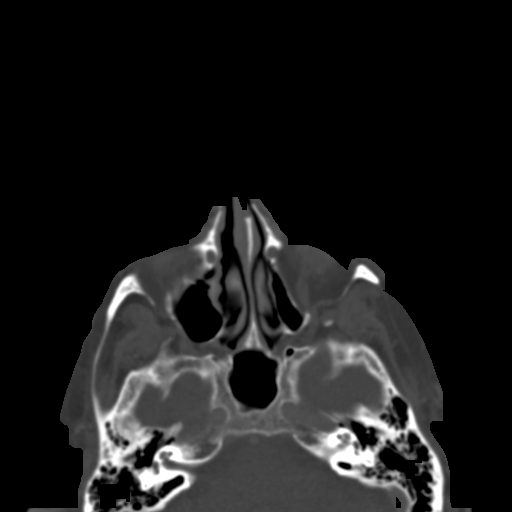
[im 69/85  bone]
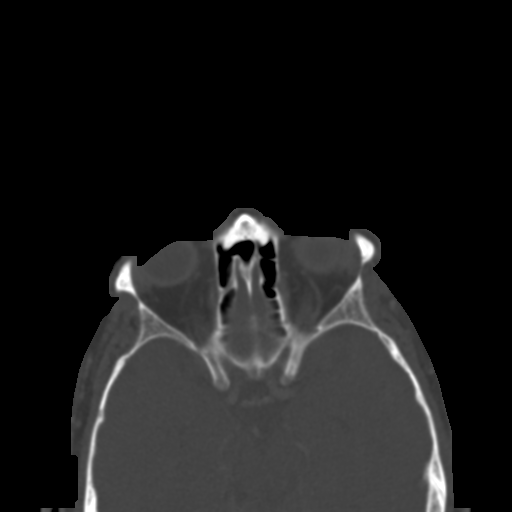
[im 77/85  bone]
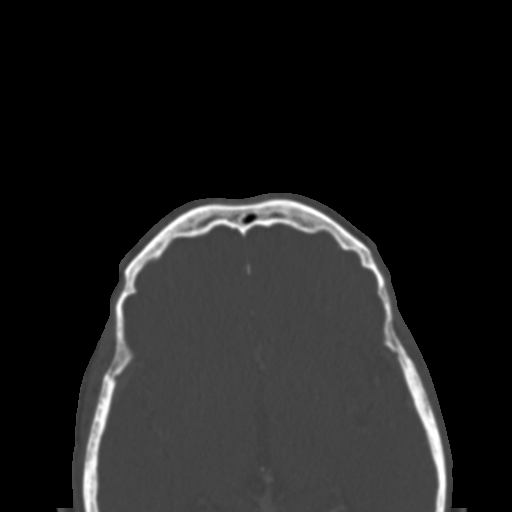

[Series 10: coronal soft · coronal · 0.38mm/px · 3 of 63 slices shown]
[im 21/63  bone]
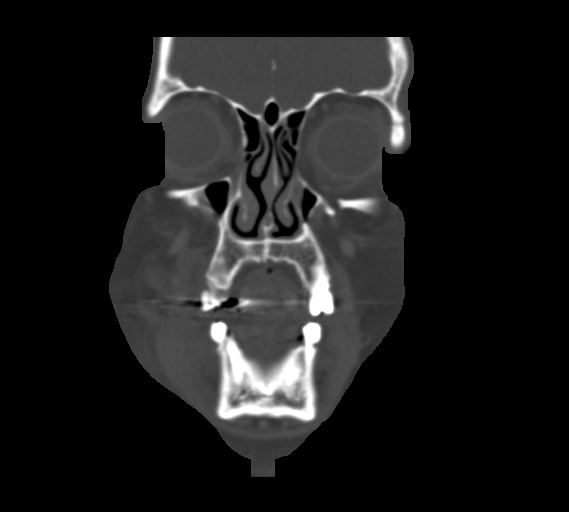
[im 28/63  bone]
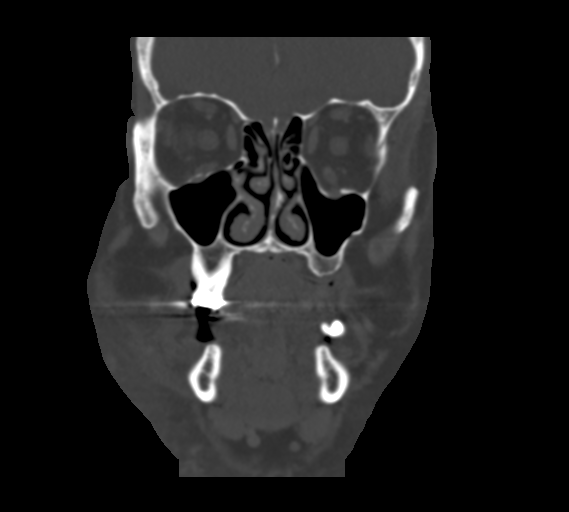
[im 35/63  bone]
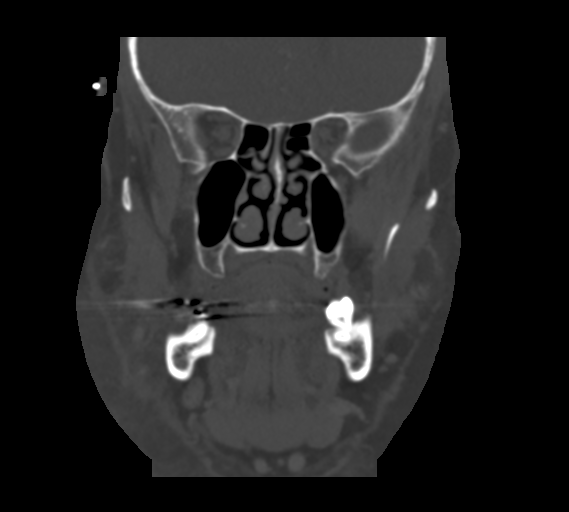

[Series 12: sagittal soft · sagittal · 0.25mm/px · 3 of 96 slices shown]
[im 32/96  bone]
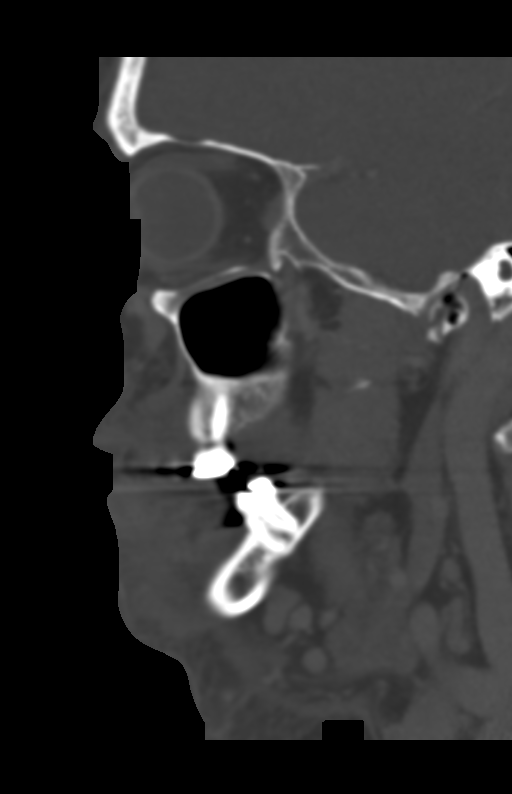
[im 48/96  bone]
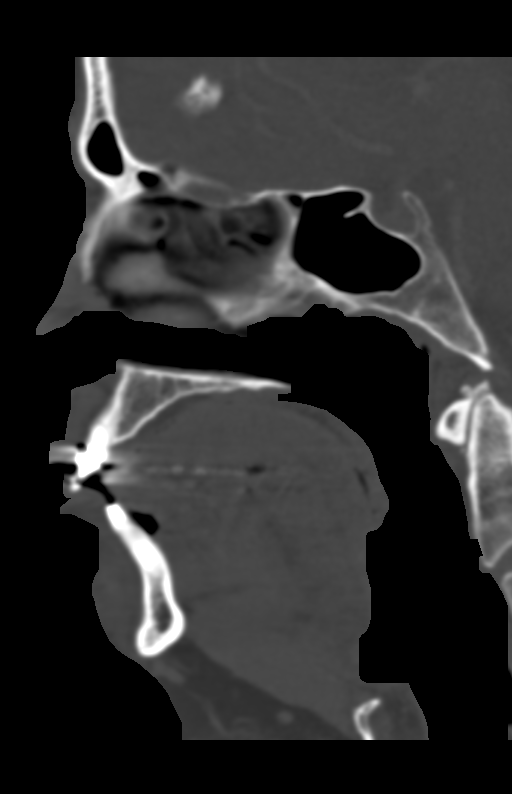
[im 64/96  bone]
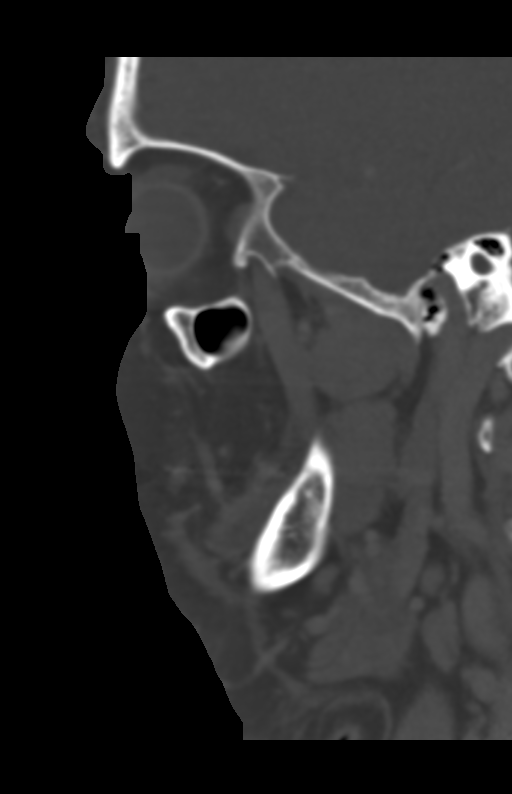

[14 of 47 positions shown; findings below may reference images not displayed]

FINDINGS: Osseous: Streak artifact from dental restoration limits evaluation
of the oral cavity. There is poor dentition with multiple missing
teeth. No definite periapical lucencies are identified. Presumed
chronic fracture deformities of the left orbital floor and left
maxillary sinus.

Orbits: The globes and orbits are otherwise unremarkable.

Sinuses: No significant paranasal sinus disease or mastoid effusion
at the imaged levels.

Soft tissues: There is prominent inflammatory stranding within the
right perimandibular soft tissues extending into the upper neck,
consistent with cellulitis changes. No organized soft tissue abscess
is demonstrated. No definite inflammatory stranding is identified
within the floor of mouth.

Limited intracranial: No abnormality.
IMPRESSION: Prominent inflammatory stranding within the right perimandibular
soft tissues and extending into the upper neck consistent with
cellulitis changes. No organized soft tissue abscess is
demonstrated. Findings may be secondary to an odontogenic source,
although no candidate tooth is definitively identified.

Presumed chronic fracture deformities of the left orbital floor and
left maxillary sinus.

## 2019-06-30 MED ORDER — SODIUM CHLORIDE 0.9 % IV SOLN
1.0000 g | Freq: Once | INTRAVENOUS | Status: AC
  Administered 2019-06-30: 1 g via INTRAVENOUS
  Filled 2019-06-30: qty 10

## 2019-06-30 MED ORDER — OXYCODONE HCL 5 MG PO TABS: 5.0000 mg | ORAL_TABLET | Freq: Three times a day (TID) | ORAL | 0 refills | Status: AC | PRN

## 2019-06-30 MED ORDER — SODIUM CHLORIDE 0.9 % IV BOLUS
1000.0000 mL | Freq: Once | INTRAVENOUS | Status: AC
  Administered 2019-06-30: 1000 mL via INTRAVENOUS

## 2019-06-30 MED ORDER — SULFAMETHOXAZOLE-TRIMETHOPRIM 800-160 MG PO TABS: 1.0000 | ORAL_TABLET | Freq: Two times a day (BID) | ORAL | 0 refills | Status: DC

## 2019-06-30 MED ORDER — PREDNISONE 10 MG PO TABS: ORAL_TABLET | ORAL | 0 refills | Status: AC

## 2019-06-30 MED ORDER — MORPHINE SULFATE (PF) 4 MG/ML IV SOLN
4.0000 mg | Freq: Once | INTRAVENOUS | Status: AC
  Administered 2019-06-30: 4 mg via INTRAVENOUS
  Filled 2019-06-30: qty 1

## 2019-06-30 MED ORDER — IOHEXOL 300 MG/ML  SOLN
75.0000 mL | Freq: Once | INTRAMUSCULAR | Status: AC | PRN
  Administered 2019-06-30: 75 mL via INTRAVENOUS
  Filled 2019-06-30: qty 75

## 2019-06-30 MED ORDER — ONDANSETRON HCL 4 MG/2ML IJ SOLN
4.0000 mg | Freq: Once | INTRAMUSCULAR | Status: AC
  Administered 2019-06-30: 4 mg via INTRAVENOUS
  Filled 2019-06-30: qty 2

## 2019-06-30 MED ORDER — CEPHALEXIN 500 MG PO CAPS: 500.0000 mg | ORAL_CAPSULE | Freq: Three times a day (TID) | ORAL | 0 refills | Status: AC

## 2019-06-30 MED ORDER — METHYLPREDNISOLONE SODIUM SUCC 125 MG IJ SOLR
125.0000 mg | Freq: Once | INTRAMUSCULAR | Status: AC
  Administered 2019-06-30: 125 mg via INTRAVENOUS
  Filled 2019-06-30: qty 2

## 2019-06-30 NOTE — ED Triage Notes (Signed)
Pt has swelling to right side of jaw.  Pt was seen in er yesterday with similar sx.  No resp distress.  Pt denies toothache.  Pt alert.

## 2019-06-30 NOTE — ED Notes (Signed)
To CT

## 2019-06-30 NOTE — Discharge Instructions (Addendum)
Please return to the ER if not improving over the next day or two. If you get worse or develop a fever, return to the ER. If you develop swelling in your neck or tongue, return to the ER.

## 2019-06-30 NOTE — ED Provider Notes (Signed)
[facility] Emergency Department Provider Note  ____________________________________________  Time seen: Approximately 4:14 PM  I have reviewed the triage vital signs and the nursing notes.   HISTORY  Chief Complaint Facial Swelling   HPI [name] is a 54 y.o. male presents to the emergency department for treatment and evaluation of facial swelling.   He was evaluated here for the same yesterday.  Symptoms started initially approximately 7 days ago and he was placed on clindamycin and given steroids.  He states that he has been compliant with medications.  Patient reports a longstanding history of a "staph infection" of his skin and has had multiple lesions.  When evaluated yesterday, he had an I&D of a lesion on his buttock and face.  He states that the area on his buttock has improved, but the area on his face has significantly worsened.  He denies fever.  He denies difficulty speaking or swallowing.  He denies sensation of swelling in the tongue.  Past Medical History:  Diagnosis Date  . Hematuria   . History of kidney stones   . Left ureteral calculus   . Nephrolithiasis     There are no problems to display for this patient.   Past Surgical History:  Procedure Laterality Date  . CYSTO/  URETEROSCOPY LASER LITHOTRIPSY WITH STONE EXTRACTION/  STENT PLACEMENT Right 2014   (raliegh)  . CYSTOSCOPY WITH URETEROSCOPY AND STENT PLACEMENT Left 12/23/2013   Procedure: LEFT URETEROSCOPY WITH STENT EXCHANGE, STONE EXTRACTION WITH BASKET;  Surgeon: Malka So, MD;  Location: [facility];  Service: Urology;  Laterality: Left;  . CYSTOSCOPY/RETROGRADE/URETEROSCOPY Left 12/08/2013   Procedure: CYSTOSCOPY/LEFT RETROGRADE PYELOGRAM/ INSERTION LEFT URETERAL STENT;  Surgeon: Malka So, MD;  Location: [facility];  Service: Urology;  Laterality: Left;    Prior to Admission medications   Medication Sig Start Date End Date Taking? Authorizing Provider   aspirin 81 MG EC tablet Take 1 tablet (81 mg total) by mouth daily. Swallow whole. 09/20/16   Earleen Newport, MD  cephALEXin (KEFLEX) 500 MG capsule Take 1 capsule (500 mg total) by mouth 3 (three) times daily for 10 days. 06/30/19 07/10/19  Eliga Arvie, Johnette Abraham B, FNP  hydrOXYzine (ATARAX/VISTARIL) 50 MG tablet Take 1 tablet (50 mg total) by mouth 3 (three) times daily as needed. 06/24/19   Sable Feil, PA-C  Multiple Vitamin (MULTIVITAMIN WITH MINERALS) TABS tablet Take 1 tablet by mouth daily.    [provider]  naproxen sodium (ANAPROX) 220 MG tablet Take 440 mg by mouth 2 (two) times daily as needed (pain).    [provider]  oxyCODONE (ROXICODONE) 5 MG immediate release tablet Take 1 tablet (5 mg total) by mouth every 8 (eight) hours as needed. For breakthrough pain. 06/30/19 06/29/20  Stella Encarnacion, Johnette Abraham B, FNP  oxyCODONE-acetaminophen (PERCOCET) 5-325 MG tablet Take 1 tablet by mouth every 8 (eight) hours as needed for up to 2 days for severe pain. 06/29/19 07/01/19  Laban Emperor, PA-C  polyethylene glycol (MIRALAX / GLYCOLAX) 17 g packet Take 17 g by mouth daily. Mix one tablespoon with 8oz of your favorite juice or water every day until you are having soft formed stools. Then start taking once daily if you didn't have a stool the day before. 05/17/19   Merlyn Lot, MD  predniSONE (DELTASONE) 10 MG tablet Take 6 tablets (60 mg total) by mouth daily for 2 days, THEN 5 tablets (50 mg total) daily for 2 days, THEN 4 tablets (  40 mg total) daily for 2 days, THEN 3 tablets (30 mg total) daily for 2 days, THEN 2 tablets (20 mg total) daily for 2 days, THEN 1 tablet (10 mg total) daily for 2 days. 06/30/19 07/12/19  Ariona Deschene, Rulon Eisenmenger B, FNP  sulfamethoxazole-trimethoprim (BACTRIM DS) 800-160 MG tablet Take 1 tablet by mouth 2 (two) times daily. 06/30/19   Ambermarie Honeyman, Rulon Eisenmenger B, FNP  tamsulosin (FLOMAX) 0.4 MG CAPS capsule Take 1 capsule (0.4 mg total) by mouth daily after supper. 10/27/18   Evon Slack, PA-C  mupirocin nasal ointment (BACTROBAN NASAL) 2 % Place 1 application into the nose 2 (two) times daily. Use one-half of tube in each nostril twice daily for five (5) days. 01/24/18 12/08/18  Joni Reining, PA-C    Allergies Patient has no known allergies.  No family history on file.  Social History Social History   Tobacco Use  . Smoking status: Former Smoker    Packs/day: 0.50    Types: Cigarettes  . Smokeless tobacco: Never Used  Substance Use Topics  . Alcohol use: Not Currently  . Drug use: No    Review of Systems  Constitutional: Negative for fever. Respiratory: Negative for cough or shortness of breath.  Musculoskeletal: Negative for myalgias Skin: Positive for facial swelling. Neurological: Negative for numbness or paresthesias. ____________________________________________   PHYSICAL EXAM:  VITAL SIGNS: ED Triage Vitals  Enc Vitals Group     BP 06/30/19 1553 (!) 150/94     Pulse Rate 06/30/19 1553 (!) 102     Resp 06/30/19 1553 20     Temp 06/30/19 1553 98.5 F (36.9 C)     Temp Source 06/30/19 1553 Oral     SpO2 06/30/19 1553 100 %     Weight 06/30/19 1554 249 lb (112.9 kg)     Height 06/30/19 1554 5\' 8"  (1.727 m)     Head Circumference --      Peak Flow --      Pain Score 06/30/19 1554 10     Pain Loc --      Pain Edu? --      Excl. in GC? --      Constitutional: Uncomfortable appearing. Eyes: Conjunctivae are clear without discharge or drainage. Nose: No rhinorrhea noted. Mouth/Throat: Airway is patent. No swelling of the tongue Neck: No stridor. Unrestricted range of motion observed. Cardiovascular: Capillary refill is <3 seconds.  Respiratory: Respirations are even and unlabored.. Musculoskeletal: Unrestricted range of motion observed. Neurologic: Awake, alert, and oriented x 4.  Skin:  Ride side facial swelling with area of induration, negative for fluctuant area.  ____________________________________________   LABS (all  labs ordered are listed, but only abnormal results are displayed)  Labs Reviewed  COMPREHENSIVE METABOLIC PANEL - Abnormal; Notable for the following components:      Result Value   Glucose, Bld 258 (*)    Total Bilirubin 1.3 (*)    All other components within normal limits  CBC WITH DIFFERENTIAL/PLATELET - Abnormal; Notable for the following components:   WBC 21.4 (*)    Neutro Abs 16.9 (*)    Monocytes Absolute 1.7 (*)    Abs Immature Granulocytes 0.22 (*)    All other components within normal limits  CULTURE, BLOOD (ROUTINE X 2)  CULTURE, BLOOD (ROUTINE X 2)  LACTIC ACID, PLASMA  LACTIC ACID, PLASMA   ____________________________________________  EKG  Not indicated. ____________________________________________  RADIOLOGY  Inflammatory stranding within the right perimandibular soft tissue consistent with cellulitic changes.  No  organized soft tissue abscess demonstrated. ____________________________________________   PROCEDURES  Procedures ____________________________________________   INITIAL IMPRESSION / ASSESSMENT AND PLAN / ED COURSE  KEARY WATERSON is a 54 y.o. male presenting to the emergency department for treatment and evaluation of right side facial swelling that has acutely worsened since yesterday. Plan will be to check labs and get a CT of his face and give IV Rocephin and solumedrol.   Differential diagnosis includes, but is not limited to: Facial cellulitis, facial abscess, dental abscess.  Leukocytosis is noted at 21.4.  This is likely combination of cellulitis plus recent steroid.  Lactic acid is 1.3.  BMP is unremarkable with the exception of hyperglycemia to 258 which is not uncommon for him.  CT scan does show cellulitis without any organized pocket of infection for incision and drainage.  After medications, the patient did have some reduction of the facial edema.  At this point, the patient is stable for discharge as he remains afebrile, no longer  tachycardic after fluids, not tachypneic, and normotensive.  He will be placed on Bactrim and Keflex in combination plus given a 12-day taper dose of prednisone.  He finished his initial prednisone course either last night or today.  He will also be given a prescription for Roxicodone 5 mg for breakthrough pain.  He was prescribed Percocet at last night's visit, however he states that it has been insufficient to control his pain.  Strict ER return precautions were discussed with the patient.  He is aware that if he feels that the swelling is extending into his neck, mouth, or symptoms seem to be worsening he is to return to the emergency department immediately.  He is also aware that if he develops a fever he is to return to the emergency department for that as well.  He verbalized understanding of the instructions and agrees to return for any worsening symptoms.  Medications  morphine 4 MG/ML injection 4 mg (4 mg Intravenous Given 06/30/19 1637)  sodium chloride 0.9 % bolus 1,000 mL (0 mLs Intravenous Stopped 06/30/19 1839)  ondansetron (ZOFRAN) injection 4 mg (4 mg Intravenous Given 06/30/19 1636)  methylPREDNISolone sodium succinate (SOLU-MEDROL) 125 mg/2 mL injection 125 mg (125 mg Intravenous Given 06/30/19 1638)  cefTRIAXone (ROCEPHIN) 1 g in sodium chloride 0.9 % 100 mL IVPB (0 g Intravenous Stopped 06/30/19 1838)  iohexol (OMNIPAQUE) 300 MG/ML solution 75 mL (75 mLs Intravenous Contrast Given 06/30/19 1736)  morphine 4 MG/ML injection 4 mg (4 mg Intravenous Given 06/30/19 1842)     Pertinent labs & imaging results that were available during my care of the patient were reviewed by me and considered in my medical decision making (see chart for details).  ____________________________________________   FINAL CLINICAL IMPRESSION(S) / ED DIAGNOSES  Final diagnoses:  Facial cellulitis    ED Discharge Orders         Ordered    sulfamethoxazole-trimethoprim (BACTRIM DS) 800-160 MG tablet  2 times daily      06/30/19 1837    cephALEXin (KEFLEX) 500 MG capsule  3 times daily     06/30/19 1837    predniSONE (DELTASONE) 10 MG tablet     06/30/19 1837    oxyCODONE (ROXICODONE) 5 MG immediate release tablet  Every 8 hours PRN    Note to Pharmacy: Please allow patient to fill prescription. This is for breakthrough pain.   06/30/19 1837           Note:  This document was prepared using Dragon  voice recognition software and may include unintentional dictation errors.   Chinita Pester, FNP 06/30/19 1912    Jene Every, MD 06/30/19 Serena Croissant

## 2019-07-05 LAB — CULTURE, BLOOD (ROUTINE X 2)
Culture: NO GROWTH
Culture: NO GROWTH
Special Requests: ADEQUATE
Special Requests: ADEQUATE

## 2019-11-11 ENCOUNTER — Telehealth: Payer: Self-pay | Admitting: General Practice

## 2019-11-11 NOTE — Telephone Encounter (Signed)
Individual has been contacted regarding ED referral. No further attempts to contact individual will be made. 

## 2022-10-04 DIAGNOSIS — A419 Sepsis, unspecified organism: Secondary | ICD-10-CM | POA: Diagnosis not present

## 2022-10-04 DIAGNOSIS — N200 Calculus of kidney: Secondary | ICD-10-CM | POA: Diagnosis not present

## 2022-10-04 DIAGNOSIS — Z7984 Long term (current) use of oral hypoglycemic drugs: Secondary | ICD-10-CM | POA: Diagnosis not present

## 2022-10-04 DIAGNOSIS — N132 Hydronephrosis with renal and ureteral calculous obstruction: Secondary | ICD-10-CM | POA: Diagnosis not present

## 2022-10-04 DIAGNOSIS — N179 Acute kidney failure, unspecified: Secondary | ICD-10-CM | POA: Diagnosis not present

## 2022-10-04 DIAGNOSIS — E278 Other specified disorders of adrenal gland: Secondary | ICD-10-CM | POA: Diagnosis not present

## 2022-10-04 DIAGNOSIS — R509 Fever, unspecified: Secondary | ICD-10-CM | POA: Diagnosis not present

## 2022-10-04 DIAGNOSIS — I83811 Varicose veins of right lower extremities with pain: Secondary | ICD-10-CM | POA: Diagnosis not present

## 2022-10-04 DIAGNOSIS — E119 Type 2 diabetes mellitus without complications: Secondary | ICD-10-CM | POA: Diagnosis not present

## 2022-10-05 DIAGNOSIS — N200 Calculus of kidney: Secondary | ICD-10-CM | POA: Diagnosis not present

## 2022-10-05 DIAGNOSIS — R8281 Pyuria: Secondary | ICD-10-CM | POA: Diagnosis not present

## 2022-10-05 DIAGNOSIS — N132 Hydronephrosis with renal and ureteral calculous obstruction: Secondary | ICD-10-CM | POA: Diagnosis not present

## 2022-10-05 DIAGNOSIS — N179 Acute kidney failure, unspecified: Secondary | ICD-10-CM | POA: Diagnosis not present

## 2022-10-05 DIAGNOSIS — Z466 Encounter for fitting and adjustment of urinary device: Secondary | ICD-10-CM | POA: Diagnosis not present

## 2022-10-05 DIAGNOSIS — E278 Other specified disorders of adrenal gland: Secondary | ICD-10-CM | POA: Diagnosis not present

## 2022-10-06 DIAGNOSIS — N132 Hydronephrosis with renal and ureteral calculous obstruction: Secondary | ICD-10-CM | POA: Diagnosis not present

## 2022-10-06 DIAGNOSIS — R7881 Bacteremia: Secondary | ICD-10-CM | POA: Diagnosis not present

## 2022-10-06 DIAGNOSIS — R310 Gross hematuria: Secondary | ICD-10-CM | POA: Diagnosis not present

## 2022-10-07 DIAGNOSIS — N132 Hydronephrosis with renal and ureteral calculous obstruction: Secondary | ICD-10-CM | POA: Diagnosis not present

## 2022-10-07 DIAGNOSIS — N201 Calculus of ureter: Secondary | ICD-10-CM | POA: Diagnosis not present

## 2022-10-07 DIAGNOSIS — N200 Calculus of kidney: Secondary | ICD-10-CM | POA: Diagnosis not present

## 2022-10-08 DIAGNOSIS — Z9889 Other specified postprocedural states: Secondary | ICD-10-CM | POA: Diagnosis not present

## 2022-10-08 DIAGNOSIS — N132 Hydronephrosis with renal and ureteral calculous obstruction: Secondary | ICD-10-CM | POA: Diagnosis not present

## 2022-10-10 DIAGNOSIS — N200 Calculus of kidney: Secondary | ICD-10-CM | POA: Diagnosis not present

## 2022-11-06 DIAGNOSIS — N401 Enlarged prostate with lower urinary tract symptoms: Secondary | ICD-10-CM | POA: Diagnosis not present

## 2022-11-15 DIAGNOSIS — N201 Calculus of ureter: Secondary | ICD-10-CM | POA: Diagnosis not present

## 2022-11-15 DIAGNOSIS — I10 Essential (primary) hypertension: Secondary | ICD-10-CM | POA: Diagnosis not present

## 2022-11-15 DIAGNOSIS — Z87891 Personal history of nicotine dependence: Secondary | ICD-10-CM | POA: Diagnosis not present

## 2022-11-15 DIAGNOSIS — E785 Hyperlipidemia, unspecified: Secondary | ICD-10-CM | POA: Diagnosis not present

## 2022-11-15 DIAGNOSIS — Z6841 Body Mass Index (BMI) 40.0 and over, adult: Secondary | ICD-10-CM | POA: Diagnosis not present

## 2022-11-15 DIAGNOSIS — Z791 Long term (current) use of non-steroidal anti-inflammatories (NSAID): Secondary | ICD-10-CM | POA: Diagnosis not present

## 2022-11-15 DIAGNOSIS — Z79890 Hormone replacement therapy (postmenopausal): Secondary | ICD-10-CM | POA: Diagnosis not present

## 2022-11-15 DIAGNOSIS — E119 Type 2 diabetes mellitus without complications: Secondary | ICD-10-CM | POA: Diagnosis not present

## 2022-11-15 DIAGNOSIS — Z7984 Long term (current) use of oral hypoglycemic drugs: Secondary | ICD-10-CM | POA: Diagnosis not present

## 2022-11-15 DIAGNOSIS — K219 Gastro-esophageal reflux disease without esophagitis: Secondary | ICD-10-CM | POA: Diagnosis not present

## 2022-11-15 DIAGNOSIS — Z466 Encounter for fitting and adjustment of urinary device: Secondary | ICD-10-CM | POA: Diagnosis not present

## 2022-11-30 DIAGNOSIS — Z419 Encounter for procedure for purposes other than remedying health state, unspecified: Secondary | ICD-10-CM | POA: Diagnosis not present

## 2022-12-31 DIAGNOSIS — Z419 Encounter for procedure for purposes other than remedying health state, unspecified: Secondary | ICD-10-CM | POA: Diagnosis not present

## 2023-01-30 DIAGNOSIS — Z419 Encounter for procedure for purposes other than remedying health state, unspecified: Secondary | ICD-10-CM | POA: Diagnosis not present

## 2023-02-14 DIAGNOSIS — R0789 Other chest pain: Secondary | ICD-10-CM | POA: Diagnosis not present

## 2023-02-14 DIAGNOSIS — Z20822 Contact with and (suspected) exposure to covid-19: Secondary | ICD-10-CM | POA: Diagnosis not present

## 2023-02-14 DIAGNOSIS — R0602 Shortness of breath: Secondary | ICD-10-CM | POA: Diagnosis not present

## 2023-02-14 DIAGNOSIS — S0240DA Maxillary fracture, left side, initial encounter for closed fracture: Secondary | ICD-10-CM | POA: Diagnosis not present

## 2023-02-14 DIAGNOSIS — Z7984 Long term (current) use of oral hypoglycemic drugs: Secondary | ICD-10-CM | POA: Diagnosis not present

## 2023-02-14 DIAGNOSIS — S0230XA Fracture of orbital floor, unspecified side, initial encounter for closed fracture: Secondary | ICD-10-CM | POA: Diagnosis not present

## 2023-02-14 DIAGNOSIS — R42 Dizziness and giddiness: Secondary | ICD-10-CM | POA: Diagnosis not present

## 2023-02-14 DIAGNOSIS — R079 Chest pain, unspecified: Secondary | ICD-10-CM | POA: Diagnosis not present

## 2023-02-14 DIAGNOSIS — E119 Type 2 diabetes mellitus without complications: Secondary | ICD-10-CM | POA: Diagnosis not present

## 2023-02-14 DIAGNOSIS — Z87891 Personal history of nicotine dependence: Secondary | ICD-10-CM | POA: Diagnosis not present

## 2023-02-14 DIAGNOSIS — R410 Disorientation, unspecified: Secondary | ICD-10-CM | POA: Diagnosis not present

## 2023-02-15 DIAGNOSIS — R42 Dizziness and giddiness: Secondary | ICD-10-CM | POA: Diagnosis not present

## 2023-03-02 DIAGNOSIS — Z419 Encounter for procedure for purposes other than remedying health state, unspecified: Secondary | ICD-10-CM | POA: Diagnosis not present

## 2023-03-15 DIAGNOSIS — Z6837 Body mass index (BMI) 37.0-37.9, adult: Secondary | ICD-10-CM | POA: Diagnosis not present

## 2023-03-15 DIAGNOSIS — I83813 Varicose veins of bilateral lower extremities with pain: Secondary | ICD-10-CM | POA: Diagnosis not present

## 2023-03-15 DIAGNOSIS — Z1159 Encounter for screening for other viral diseases: Secondary | ICD-10-CM | POA: Diagnosis not present

## 2023-03-15 DIAGNOSIS — R930 Abnormal findings on diagnostic imaging of skull and head, not elsewhere classified: Secondary | ICD-10-CM | POA: Diagnosis not present

## 2023-03-15 DIAGNOSIS — Z125 Encounter for screening for malignant neoplasm of prostate: Secondary | ICD-10-CM | POA: Diagnosis not present

## 2023-03-15 DIAGNOSIS — K219 Gastro-esophageal reflux disease without esophagitis: Secondary | ICD-10-CM | POA: Diagnosis not present

## 2023-03-15 DIAGNOSIS — G8929 Other chronic pain: Secondary | ICD-10-CM | POA: Diagnosis not present

## 2023-03-15 DIAGNOSIS — N529 Male erectile dysfunction, unspecified: Secondary | ICD-10-CM | POA: Diagnosis not present

## 2023-03-15 DIAGNOSIS — M5442 Lumbago with sciatica, left side: Secondary | ICD-10-CM | POA: Diagnosis not present

## 2023-03-15 DIAGNOSIS — Z1211 Encounter for screening for malignant neoplasm of colon: Secondary | ICD-10-CM | POA: Diagnosis not present

## 2023-03-15 DIAGNOSIS — E118 Type 2 diabetes mellitus with unspecified complications: Secondary | ICD-10-CM | POA: Diagnosis not present

## 2023-03-15 DIAGNOSIS — Z659 Problem related to unspecified psychosocial circumstances: Secondary | ICD-10-CM | POA: Diagnosis not present

## 2023-03-15 DIAGNOSIS — N200 Calculus of kidney: Secondary | ICD-10-CM | POA: Diagnosis not present

## 2023-04-01 DIAGNOSIS — Z419 Encounter for procedure for purposes other than remedying health state, unspecified: Secondary | ICD-10-CM | POA: Diagnosis not present

## 2023-04-30 DIAGNOSIS — R21 Rash and other nonspecific skin eruption: Secondary | ICD-10-CM | POA: Diagnosis not present

## 2023-04-30 DIAGNOSIS — N200 Calculus of kidney: Secondary | ICD-10-CM | POA: Diagnosis not present

## 2023-04-30 DIAGNOSIS — M545 Low back pain, unspecified: Secondary | ICD-10-CM | POA: Diagnosis not present

## 2023-04-30 DIAGNOSIS — M549 Dorsalgia, unspecified: Secondary | ICD-10-CM | POA: Diagnosis not present

## 2023-04-30 DIAGNOSIS — Z87891 Personal history of nicotine dependence: Secondary | ICD-10-CM | POA: Diagnosis not present

## 2023-04-30 DIAGNOSIS — R110 Nausea: Secondary | ICD-10-CM | POA: Diagnosis not present

## 2023-04-30 DIAGNOSIS — E119 Type 2 diabetes mellitus without complications: Secondary | ICD-10-CM | POA: Diagnosis not present

## 2023-04-30 DIAGNOSIS — E278 Other specified disorders of adrenal gland: Secondary | ICD-10-CM | POA: Diagnosis not present

## 2023-05-02 DIAGNOSIS — Z419 Encounter for procedure for purposes other than remedying health state, unspecified: Secondary | ICD-10-CM | POA: Diagnosis not present

## 2023-06-02 DIAGNOSIS — Z419 Encounter for procedure for purposes other than remedying health state, unspecified: Secondary | ICD-10-CM | POA: Diagnosis not present

## 2023-06-15 DIAGNOSIS — E119 Type 2 diabetes mellitus without complications: Secondary | ICD-10-CM | POA: Diagnosis not present

## 2023-06-15 DIAGNOSIS — R0789 Other chest pain: Secondary | ICD-10-CM | POA: Diagnosis not present

## 2023-06-15 DIAGNOSIS — E785 Hyperlipidemia, unspecified: Secondary | ICD-10-CM | POA: Diagnosis not present

## 2023-06-15 DIAGNOSIS — Z79899 Other long term (current) drug therapy: Secondary | ICD-10-CM | POA: Diagnosis not present

## 2023-06-15 DIAGNOSIS — Z7984 Long term (current) use of oral hypoglycemic drugs: Secondary | ICD-10-CM | POA: Diagnosis not present

## 2023-06-15 DIAGNOSIS — Z87891 Personal history of nicotine dependence: Secondary | ICD-10-CM | POA: Diagnosis not present

## 2023-06-15 DIAGNOSIS — R079 Chest pain, unspecified: Secondary | ICD-10-CM | POA: Diagnosis not present

## 2023-06-15 DIAGNOSIS — K219 Gastro-esophageal reflux disease without esophagitis: Secondary | ICD-10-CM | POA: Diagnosis not present

## 2023-06-30 DIAGNOSIS — Z419 Encounter for procedure for purposes other than remedying health state, unspecified: Secondary | ICD-10-CM | POA: Diagnosis not present

## 2023-08-11 DIAGNOSIS — Z419 Encounter for procedure for purposes other than remedying health state, unspecified: Secondary | ICD-10-CM | POA: Diagnosis not present

## 2023-09-10 DIAGNOSIS — Z419 Encounter for procedure for purposes other than remedying health state, unspecified: Secondary | ICD-10-CM | POA: Diagnosis not present

## 2023-10-11 DIAGNOSIS — Z419 Encounter for procedure for purposes other than remedying health state, unspecified: Secondary | ICD-10-CM | POA: Diagnosis not present

## 2023-11-10 DIAGNOSIS — Z419 Encounter for procedure for purposes other than remedying health state, unspecified: Secondary | ICD-10-CM | POA: Diagnosis not present

## 2023-12-11 DIAGNOSIS — Z419 Encounter for procedure for purposes other than remedying health state, unspecified: Secondary | ICD-10-CM | POA: Diagnosis not present

## 2023-12-12 DIAGNOSIS — E119 Type 2 diabetes mellitus without complications: Secondary | ICD-10-CM | POA: Diagnosis not present

## 2023-12-12 DIAGNOSIS — Z7984 Long term (current) use of oral hypoglycemic drugs: Secondary | ICD-10-CM | POA: Diagnosis not present

## 2023-12-12 DIAGNOSIS — L0231 Cutaneous abscess of buttock: Secondary | ICD-10-CM | POA: Diagnosis not present

## 2023-12-12 DIAGNOSIS — Z7902 Long term (current) use of antithrombotics/antiplatelets: Secondary | ICD-10-CM | POA: Diagnosis not present

## 2024-01-11 DIAGNOSIS — Z419 Encounter for procedure for purposes other than remedying health state, unspecified: Secondary | ICD-10-CM | POA: Diagnosis not present

## 2024-01-17 ENCOUNTER — Other Ambulatory Visit: Payer: Self-pay

## 2024-01-17 ENCOUNTER — Emergency Department
Admission: EM | Admit: 2024-01-17 | Discharge: 2024-01-17 | Disposition: A | Discharge status: Home / Self Care | Attending: Emergency Medicine | Admitting: Emergency Medicine

## 2024-01-17 DIAGNOSIS — E119 Type 2 diabetes mellitus without complications: Secondary | ICD-10-CM | POA: Diagnosis not present

## 2024-01-17 DIAGNOSIS — L03111 Cellulitis of right axilla: Secondary | ICD-10-CM | POA: Diagnosis not present

## 2024-01-17 DIAGNOSIS — L02411 Cutaneous abscess of right axilla: Secondary | ICD-10-CM | POA: Diagnosis not present

## 2024-01-17 DIAGNOSIS — Z87442 Personal history of urinary calculi: Secondary | ICD-10-CM | POA: Diagnosis not present

## 2024-01-17 MED ORDER — CEPHALEXIN 500 MG PO CAPS: 500.0000 mg | ORAL_CAPSULE | Freq: Four times a day (QID) | ORAL | 0 refills | Status: DC

## 2024-01-17 MED ORDER — CLINDAMYCIN HCL 150 MG PO CAPS: 450.0000 mg | ORAL_CAPSULE | Freq: Three times a day (TID) | ORAL | 0 refills | Status: AC

## 2024-01-17 NOTE — ED Triage Notes (Signed)
 Patient states he has two abscess' to right axilla and one abscess to top of buttocks.

## 2024-01-17 NOTE — ED Provider Notes (Signed)
 [facility] Provider Note    Event Date/Time   First MD Initiated Contact with Patient 01/17/24 1547     (approximate)   History   Abscess   HPI  [name] is a 58 y.o. male with PMH of kidney stones, T2DM, erectile dysfunction presents for evaluation of multiple abscesses. Patient reports he has 2 in his right axilla as well as multiple in the gluteal cleft.  Patient reports he squeezed 1 in his right axilla and got quite a bit of purulent and bloody drainage out of it.  He was seen in the [facility] emergency department a month ago and had an abscess drained in the gluteal cleft.  He reports a new area of concern today.      Physical Exam   Triage Vital Signs: ED Triage Vitals  Encounter Vitals Group     BP 01/17/24 1438 122/82     Girls Systolic BP Percentile --      Girls Diastolic BP Percentile --      Boys Systolic BP Percentile --      Boys Diastolic BP Percentile --      Pulse Rate 01/17/24 1438 95     Resp 01/17/24 1438 18     Temp 01/17/24 1438 98.3 F (36.8 C)     Temp Source 01/17/24 1438 Oral     SpO2 01/17/24 1438 98 %     Weight 01/17/24 1439 240 lb (108.9 kg)     Height 01/17/24 1439 5' 9 (1.753 m)     Head Circumference --      Peak Flow --      Pain Score --      Pain Loc --      Pain Education --      Exclude from Growth Chart --     Most recent vital signs: Vitals:   01/17/24 1438  BP: 122/82  Pulse: 95  Resp: 18  Temp: 98.3 F (36.8 C)  SpO2: 98%   General: Awake, no distress.  CV:  Good peripheral perfusion.  Resp:  Normal effort.  Abd:  No distention.  Right axilla: 2 areas of swelling in the right axilla, first 1 is approximately 5 cm in diameter, there is overlying erythema, warmth and induration but no fluctuance, second is approximately 3 cm in diameter, not as swollen, there is an open wound less than half a centimeter in diameter where patient previously squeezed the abscess, no overlying erythema  or warmth in this area. Gluteal cleft: Area of erythema and induration at the very top of the gluteal cleft with small amount of drainage approximately 2 cm in diameter, tender to palpation, just distal to this is healing wound from previous I&D with no surrounding erythema or drainage.   ED Results / Procedures / Treatments   Labs (all labs ordered are listed, but only abnormal results are displayed) Labs Reviewed - No data to display   PROCEDURES:  Critical Care performed: No  .Ultrasound ED Soft Tissue  Date/Time: 01/17/2024 5:13 PM  Performed by: Cleaster Tinnie LABOR, PA-C Authorized by: Cleaster Tinnie LABOR, PA-C   Procedure details:    Indications: localization of abscess and evaluate for cellulitis     Transverse view:  Visualized   Longitudinal view:  Visualized   Images: not archived   Location:    Location: axilla     Side:  Right Findings:     no abscess present    cellulitis present .Ultrasound ED  Soft Tissue  Date/Time: 01/17/2024 5:14 PM  Performed by: Cleaster Tinnie LABOR, PA-C Authorized by: Cleaster Tinnie LABOR, PA-C   Procedure details:    Indications: localization of abscess and evaluate for cellulitis     Transverse view:  Visualized   Longitudinal view:  Visualized Location:    Location: buttocks     Side:  Midline Findings:     no abscess present    MEDICATIONS ORDERED IN ED: Medications - No data to display   IMPRESSION / MDM / ASSESSMENT AND PLAN / ED COURSE  I reviewed the triage vital signs and the nursing notes.                             58 year old male presents for evaluation of multiple abscesses.  Vital signs are stable patient NAD on exam.  Differential diagnosis includes, but is not limited to, cellulitis, abscess, folliculitis.  Patient's presentation is most consistent with acute, uncomplicated illness.  All areas of concern within the axilla and gluteal cleft were evaluated using bedside ultrasound.  Did not see any  pockets of fluid consistent with an abscess.  Right axilla definitely has some cellulitis.  Explained to the patient I do not think he would get much benefit out of an I&D as there is no clear pocket of fluid for me to drain.  I will start him on oral antibiotics.  I did advise warm compresses.  He was given some chlorhexidine soap to wash with.  Discussed following up with his primary care provider.  Patient voiced understanding, all questions were answered and he is stable at discharge.      FINAL CLINICAL IMPRESSION(S) / ED DIAGNOSES   Final diagnoses:  Cellulitis of right axilla     Rx / DC Orders   ED Discharge Orders          Ordered    cephALEXin  (KEFLEX ) 500 MG capsule  4 times daily,   Status:  Discontinued        01/17/24 1717    clindamycin  (CLEOCIN ) 150 MG capsule  3 times daily        01/17/24 1725             Note:  This document was prepared using Dragon voice recognition software and may include unintentional dictation errors.   Cleaster Tinnie LABOR, PA-C 01/17/24 1727    Floy Roberts, MD 01/17/24 [phone]

## 2024-01-17 NOTE — ED Notes (Addendum)
 Pt at nurse station reporting he has been here 3hrs and nothing has been done and inquiring how much longer. This RN explaining that this RN is unaware of care plan due to not being primary RN. RN directing pt back to room and reporting physician should be in shortly. Pt walking back into room and mumbling, wow this is [employer].

## 2024-01-17 NOTE — Discharge Instructions (Signed)
 The areas of concern today do not appear consist with an abscess. It does look like a superficial skin infection called cellulitis. Please take the antibiotics as prescribed. Return the ED with any worsening symptoms like increased swelling, redness, or development of fevers.

## 2024-02-10 DIAGNOSIS — Z419 Encounter for procedure for purposes other than remedying health state, unspecified: Secondary | ICD-10-CM | POA: Diagnosis not present

## 2024-04-11 ENCOUNTER — Emergency Department: Admission: EM | Admit: 2024-04-11 | Discharge: 2024-04-11 | Disposition: A | Discharge status: Home / Self Care

## 2024-04-11 ENCOUNTER — Encounter: Payer: Self-pay | Admitting: Emergency Medicine

## 2024-04-11 ENCOUNTER — Other Ambulatory Visit: Payer: Self-pay

## 2024-04-11 DIAGNOSIS — L089 Local infection of the skin and subcutaneous tissue, unspecified: Secondary | ICD-10-CM | POA: Insufficient documentation

## 2024-04-11 DIAGNOSIS — B958 Unspecified staphylococcus as the cause of diseases classified elsewhere: Secondary | ICD-10-CM | POA: Diagnosis not present

## 2024-04-11 MED ORDER — OXYCODONE-ACETAMINOPHEN 5-325 MG PO TABS: 1.0000 | ORAL_TABLET | ORAL | 0 refills | Status: AC | PRN

## 2024-04-11 MED ORDER — MUPIROCIN 2 % EX OINT: 1.0000 | TOPICAL_OINTMENT | Freq: Two times a day (BID) | CUTANEOUS | 0 refills | Status: AC

## 2024-04-11 MED ORDER — SULFAMETHOXAZOLE-TRIMETHOPRIM 800-160 MG PO TABS: 1.0000 | ORAL_TABLET | Freq: Two times a day (BID) | ORAL | 0 refills | Status: AC

## 2024-04-11 MED ORDER — CHLORHEXIDINE GLUCONATE 4 % EX SOLN: Freq: Every day | CUTANEOUS | 6 refills | Status: AC | PRN

## 2024-04-11 MED ORDER — CEPHALEXIN 500 MG PO CAPS: 500.0000 mg | ORAL_CAPSULE | Freq: Four times a day (QID) | ORAL | 0 refills | Status: AC

## 2024-04-11 NOTE — ED Provider Notes (Signed)
 [facility] Provider Note    Event Date/Time   First MD Initiated Contact with Patient 04/11/24 1421     (approximate)   History   Rash   HPI  NTHONY LEFFERTS is a 58 y.o. male history of kidney stones, abscesses presents emergency department with multiple abscesses on his body, on his arms legs buttocks and 1 on the back of his neck.  States they are painful and swollen.  Some he has been able to open on his own.  States she usually has some chunky discharge with it.  States that he used to get it cleared up when he would see someone here in the ED and he was given a body wash to use.  States he looked at the pharmacy for and could not find it.  Denies fever, chills.      Physical Exam   Triage Vital Signs: ED Triage Vitals  Encounter Vitals Group     BP 04/11/24 1224 136/81     Girls Systolic BP Percentile --      Girls Diastolic BP Percentile --      Boys Systolic BP Percentile --      Boys Diastolic BP Percentile --      Pulse Rate 04/11/24 1224 94     Resp 04/11/24 1224 18     Temp 04/11/24 1224 98.4 F (36.9 C)     Temp Source 04/11/24 1224 Oral     SpO2 04/11/24 1224 97 %     Weight 04/11/24 1226 240 lb 1.3 oz (108.9 kg)     Height 04/11/24 1415 5' 9 (1.753 m)     Head Circumference --      Peak Flow --      Pain Score 04/11/24 1226 10     Pain Loc --      Pain Education --      Exclude from Growth Chart --     Most recent vital signs: Vitals:   04/11/24 1224  BP: 136/81  Pulse: 94  Resp: 18  Temp: 98.4 F (36.9 C)  SpO2: 97%     General: Awake, no distress.   CV:  Good peripheral perfusion.  Resp:  Normal effort.  Abd:  No distention.   Other:  Skin with multiple healing scabbed wounds on the arms legs and buttocks, hard indurated raised area on the back of his neck, tender to palpation.  Neurovascular intact   ED Results / Procedures / Treatments   Labs (all labs ordered are listed, but only abnormal results are  displayed) Labs Reviewed - No data to display   EKG     RADIOLOGY     PROCEDURES:   Procedures  Critical Care:  no Chief Complaint  Patient presents with   Rash      MEDICATIONS ORDERED IN ED: Medications - No data to display   IMPRESSION / MDM / ASSESSMENT AND PLAN / ED COURSE  I reviewed the triage vital signs and the nursing notes.                              Differential diagnosis includes, but is not limited to, abscess, cellulitis, MRSA  Patient's presentation is most consistent with acute illness / injury with system symptoms.   With the patient's chronic symptoms with this, I do have concerns he has MRSA.  Will place him on Keflex  and Bactrim , prescription for Bactroban  ointment  to apply in the nose and topically.  Also gave a prescription for Hibiclens and he is to use this daily.  Stop using the Dial antibacterial soap.  Some of the soaps may actually be killing the good bacteria on his skin.  He states he understands.  He is to follow-up with [facility] if not improving in 3 to 4 days.  He is in agreement treatment plan.  Discharged stable condition      FINAL CLINICAL IMPRESSION(S) / ED DIAGNOSES   Final diagnoses:  Staph skin infection     Rx / DC Orders   ED Discharge Orders          Ordered    cephALEXin  (KEFLEX ) 500 MG capsule  4 times daily        04/11/24 1446    sulfamethoxazole -trimethoprim  (BACTRIM  DS) 800-160 MG tablet  2 times daily        04/11/24 1446    mupirocin  ointment (BACTROBAN ) 2 %  2 times daily        04/11/24 1446    chlorhexidine (HIBICLENS) 4 % external liquid  Daily PRN        04/11/24 1446    oxyCODONE -acetaminophen  (PERCOCET) 5-325 MG tablet  Every 4 hours PRN        04/11/24 1448             Note:  This document was prepared using Dragon voice recognition software and may include unintentional dictation errors.    Gasper Devere ORN, PA-C 04/11/24 1609    Clarine Ozell LABOR, MD 04/11/24  [phone]

## 2024-04-11 NOTE — ED Notes (Signed)
 See triage note  Presents with a rash  but thinks it may be small abscess areas   Has areas to buttocks,neck abd nose area Afebrile on arrival

## 2024-04-11 NOTE — Discharge Instructions (Signed)
 Follow-up with your regular doctor.  Follow-up with [facility] if areas are not improving with medication.  Return if worsening.  Use the Hibiclens daily.  Stop using the Dial bacteria soap.  This actually may be making your areas worse.  If you return to a normal soap use a mild soap like Ivory or [address]

## 2024-04-11 NOTE — ED Triage Notes (Addendum)
 C/O multiple areas of swelling, boil like, back of neck, nose, buttocks x 2 days. Neck is most painful area.
